# Patient Record
Sex: Male | Born: 1965
Health system: Southern US, Community
[De-identification: ages and names within clinical notes are randomized; demographics above are authoritative.]

## PROBLEM LIST (undated history)

## (undated) DIAGNOSIS — I10 Essential (primary) hypertension: Secondary | ICD-10-CM

## (undated) HISTORY — DX: Essential (primary) hypertension: I10

---

## 2002-08-19 ENCOUNTER — Ambulatory Visit (HOSPITAL_COMMUNITY): Admission: RE | Admit: 2002-08-19 | Discharge: 2002-08-19 | Payer: Self-pay | Admitting: General Surgery

## 2003-05-22 ENCOUNTER — Emergency Department (HOSPITAL_COMMUNITY): Admission: EM | Admit: 2003-05-22 | Discharge: 2003-05-23 | Payer: Self-pay | Admitting: Emergency Medicine

## 2003-05-25 ENCOUNTER — Ambulatory Visit (HOSPITAL_COMMUNITY): Admission: RE | Admit: 2003-05-25 | Discharge: 2003-05-25 | Payer: Self-pay | Admitting: Orthopedic Surgery

## 2006-07-21 ENCOUNTER — Ambulatory Visit: Admission: RE | Admit: 2006-07-21 | Discharge: 2006-07-21 | Payer: Self-pay | Admitting: *Deleted

## 2006-07-22 ENCOUNTER — Ambulatory Visit (HOSPITAL_COMMUNITY): Admission: RE | Admit: 2006-07-22 | Discharge: 2006-07-22 | Payer: Self-pay | Admitting: *Deleted

## 2006-07-30 ENCOUNTER — Ambulatory Visit: Payer: Self-pay | Admitting: Pulmonary Disease

## 2008-05-04 ENCOUNTER — Encounter (INDEPENDENT_AMBULATORY_CARE_PROVIDER_SITE_OTHER): Payer: Self-pay | Admitting: Family Medicine

## 2008-05-05 ENCOUNTER — Inpatient Hospital Stay (HOSPITAL_COMMUNITY): Admission: EM | Admit: 2008-05-05 | Discharge: 2008-05-06 | Payer: Self-pay | Admitting: Emergency Medicine

## 2009-08-06 IMAGING — CT CT ANGIO CHEST
1 of 4 series · 19 of 36 positions shown · IV contrast (Omnipaque 300)
Comparison: Chest x-ray 05/02/2008

CLINICAL DATA: Nausea vomiting and diarrhea.  Passing out.
Elevated heart rate and D-dimer.

CT ANGIOGRAPHY CHEST
TECHNIQUE: Multidetector CT imaging of the chest using the
standard protocol during bolus administration of intravenous
contrast. Multiplanar reconstructed images including MIPs were
obtained and reviewed to evaluate the vascular anatomy.
Contrast: 80 ml Omnipaque 350.

[Series 8: thin pacs · axial · 0.77mm/px · z∈[-548,-270]mm · 19 of 310 slices shown]
[im 16/310  lung]
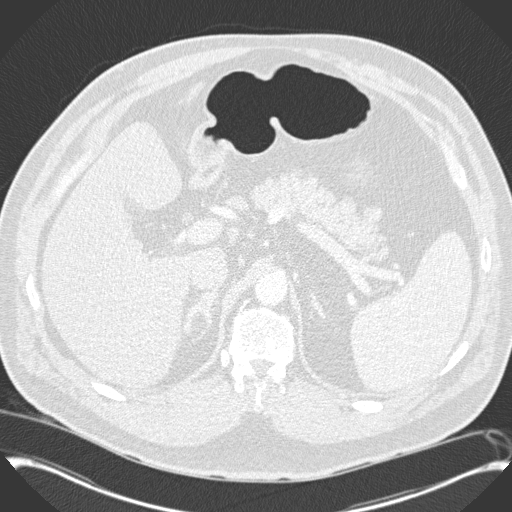
[im 31/310  mediastinal]
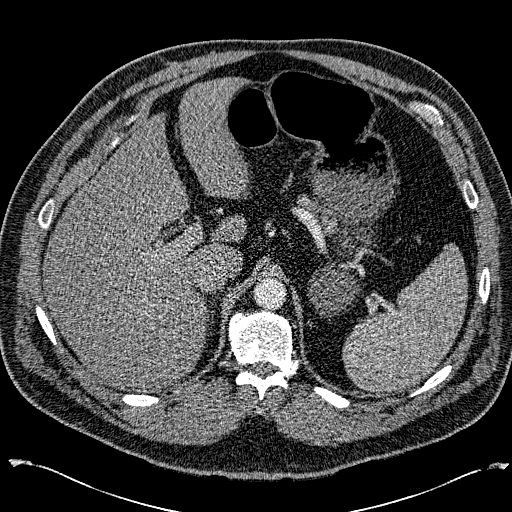
[im 47/310  lung]
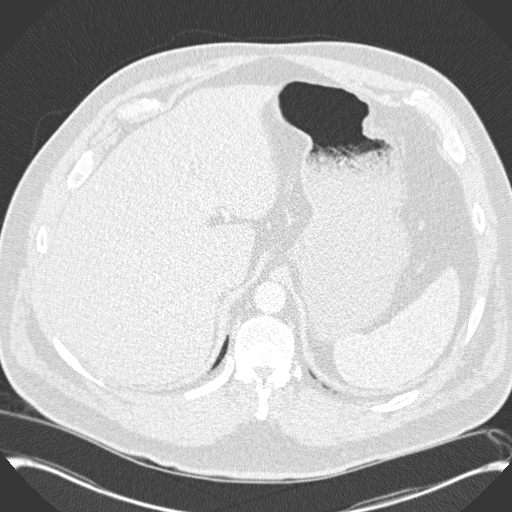
[im 62/310  mediastinal]
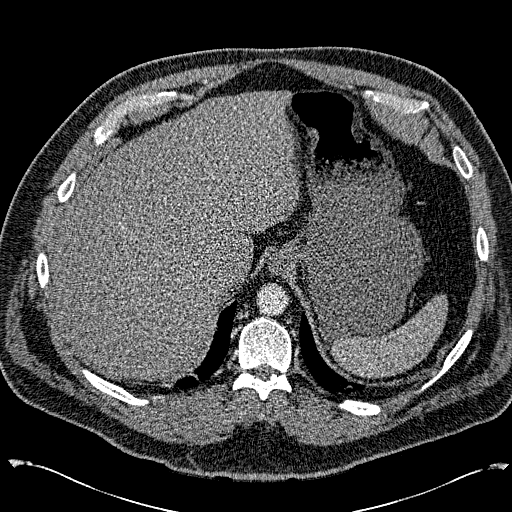
[im 78/310  lung]
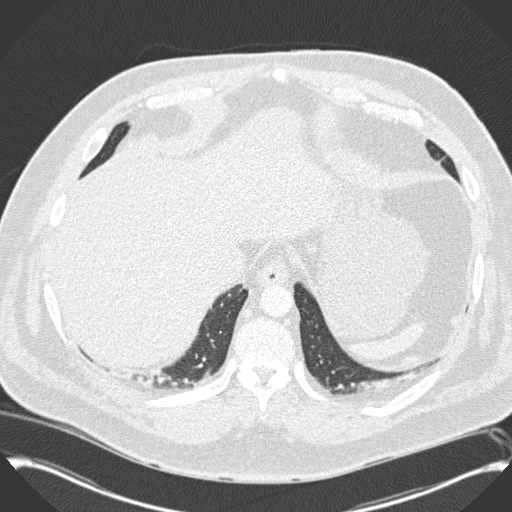
[im 93/310  mediastinal]
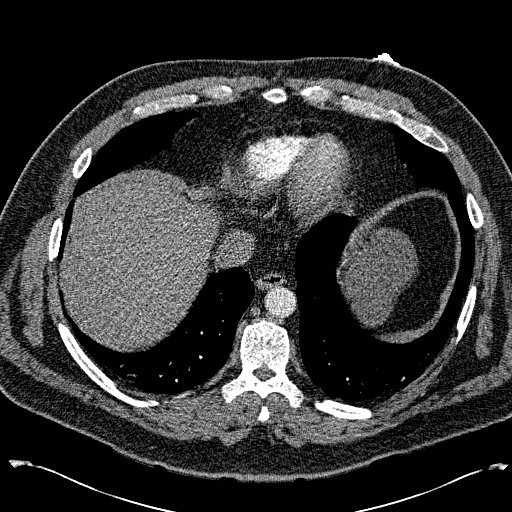
[im 109/310  lung]
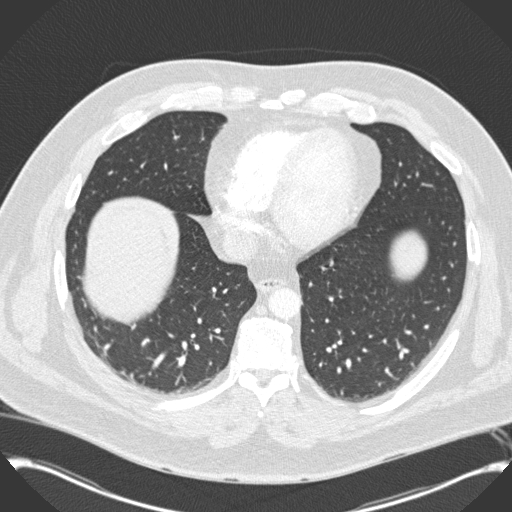
[im 124/310  mediastinal]
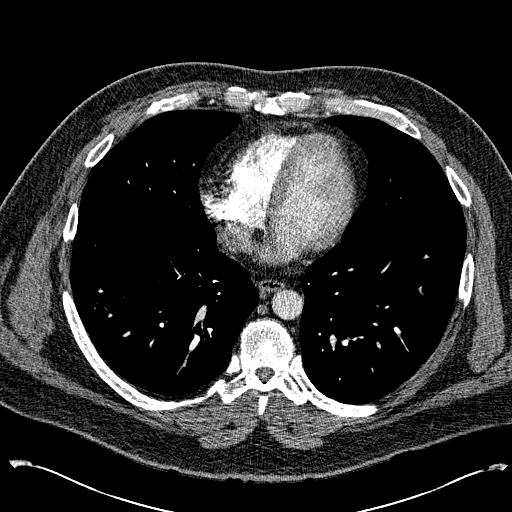
[im 140/310  lung]
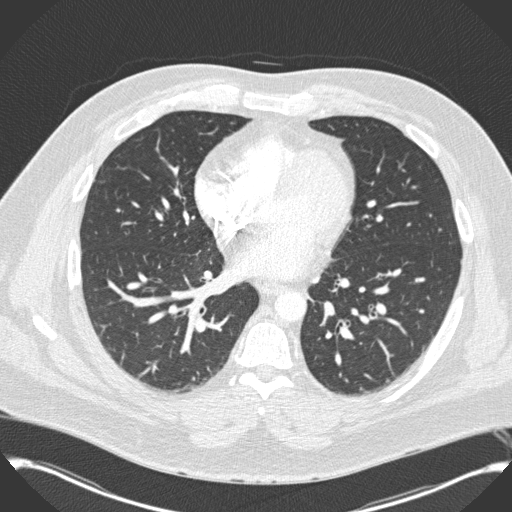
[im 155/310  mediastinal]
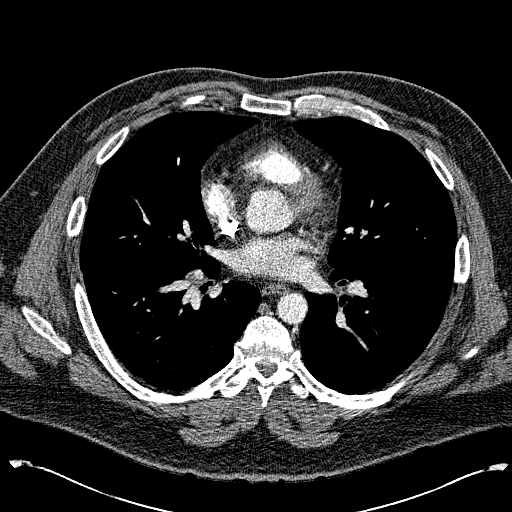
[im 170/310  lung]
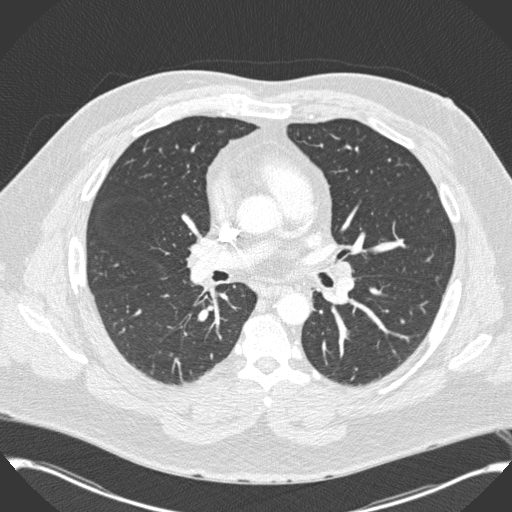
[im 186/310  mediastinal]
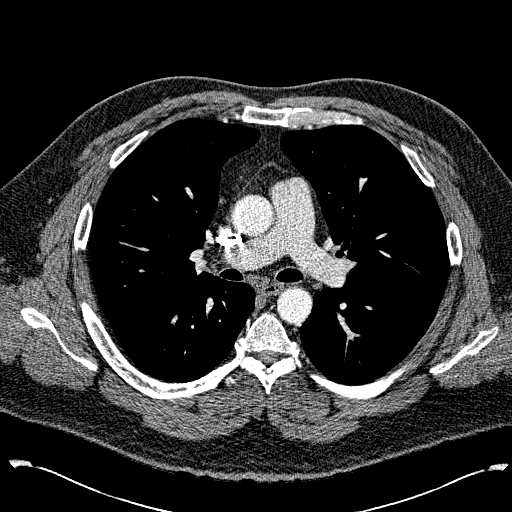
[im 201/310  lung]
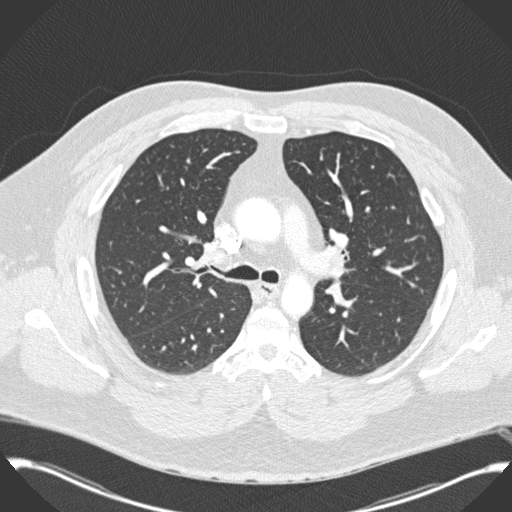
[im 217/310  mediastinal]
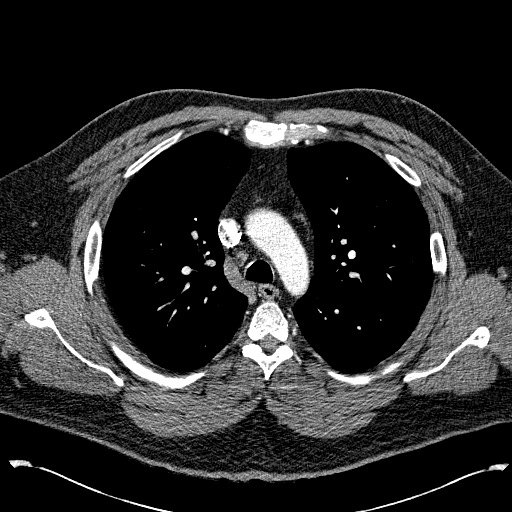
[im 232/310  lung]
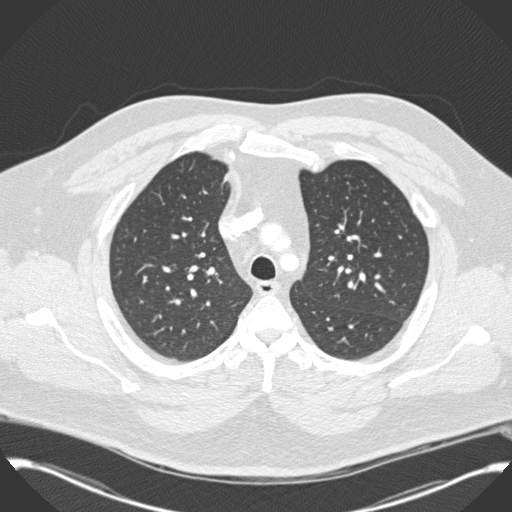
[im 248/310  mediastinal]
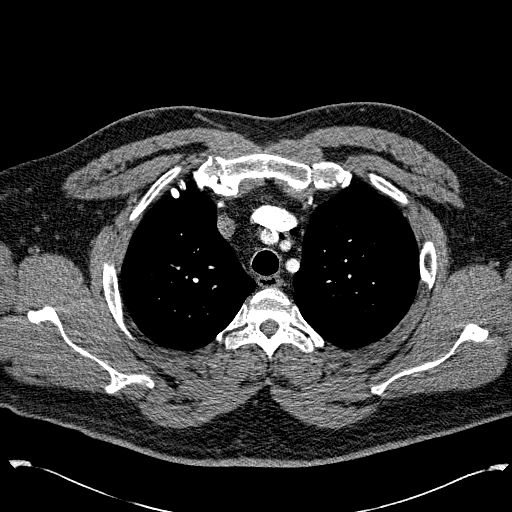
[im 263/310  lung]
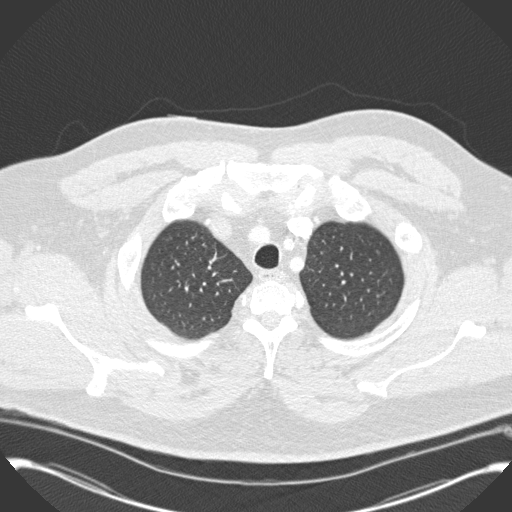
[im 279/310  mediastinal]
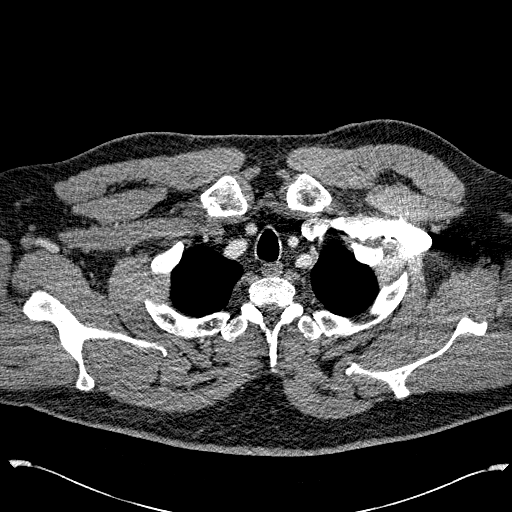
[im 294/310  lung]
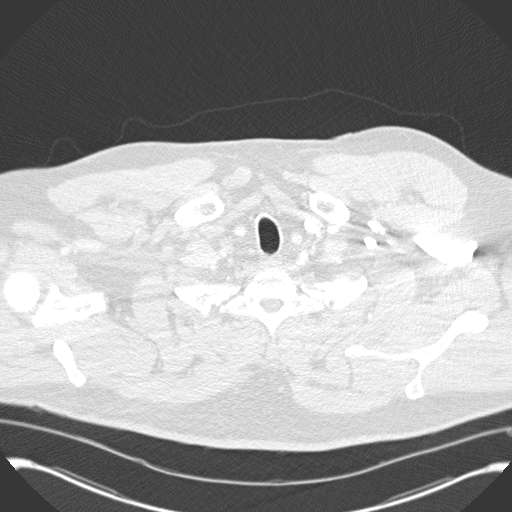

[19 of 36 positions shown; findings below may reference images not displayed]

FINDINGS: There is suboptimal opacification of the pulmonary
arterial system.  Despite this, no significant lobar or segmental
pulmonary embolus is present.  There is no significant mediastinal
or axillary adenopathy.  The heart size is normal.  No significant
pleural or pericardial effusion is present.  Limited imaging of the
upper abdomen is unremarkable.  Rule minimal dependent atelectasis
is present at the lung bases.  Lungs are otherwise clear without
focal nodule, mass, or airspace disease.
IMPRESSION: 1.  Suboptimal contrast bolus.
2.  No evidence for proximal embolus.
3.  Minimal dependent atelectasis.

## 2009-08-07 IMAGING — US US EXTREM LOW VENOUS BILAT
1 series · 13 of 24 positions shown · non-contrast
Comparison: None.

CLINICAL DATA: Elevated D-dimer.

VENOUS DUPLEX ULTRASOUND OF BILATERAL LOWER EXTREMITIES
TECHNIQUE: Gray-scale sonography with graded compression, as well
as color Doppler and duplex ultrasound, were performed to evaluate
the deep venous system of both lower extremities from the level of
the common femoral vein through the popliteal and proximal calf
veins. Spectral Doppler was utilized to evaulate flow at rest and
with distal augmentation maneuvers.

[Series 1: us extrem low venous bilat · 13 of 59 slices shown]
[im 1/59]
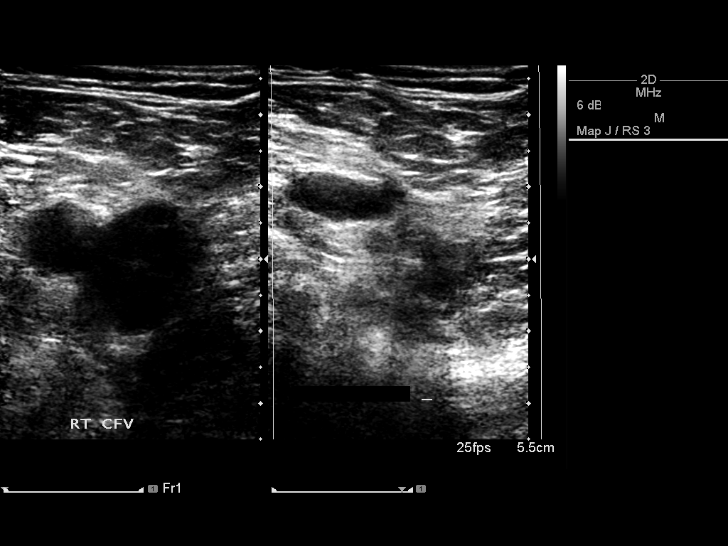
[im 6/59]
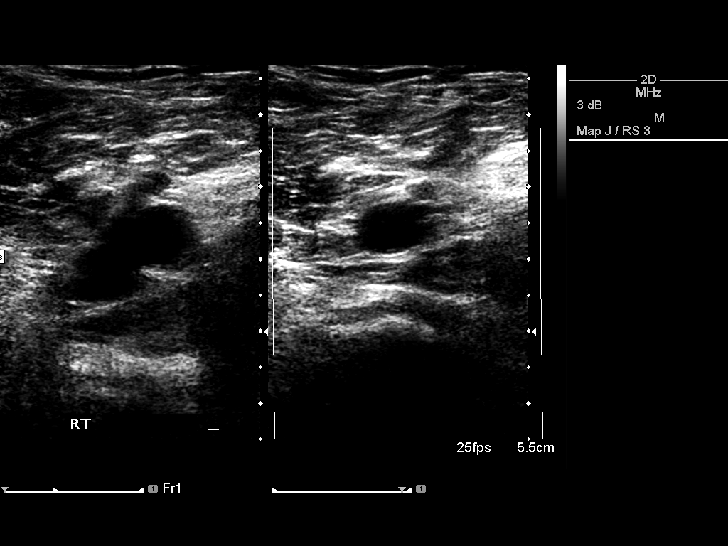
[im 11/59]
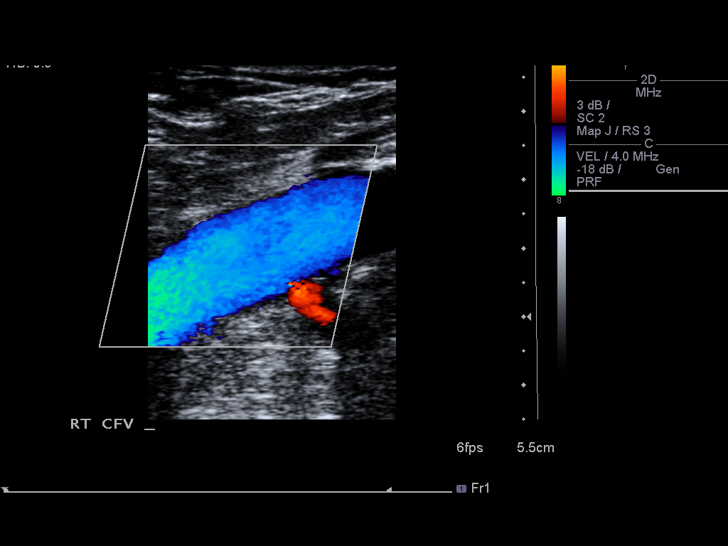
[im 16/59]
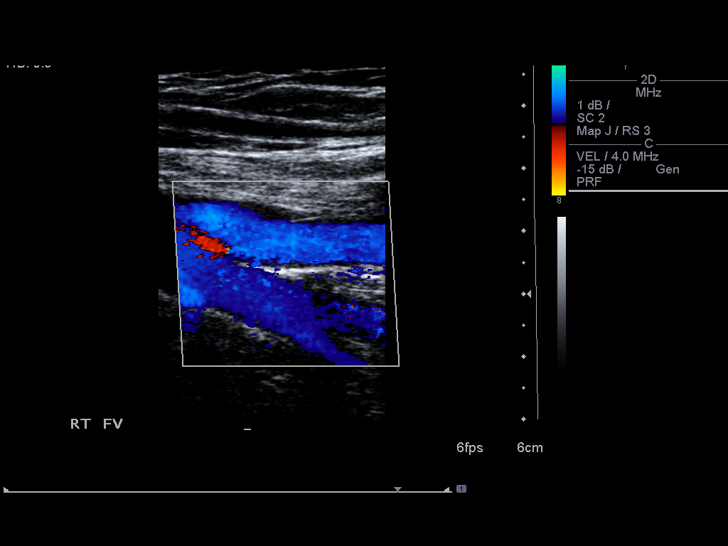
[im 21/59]
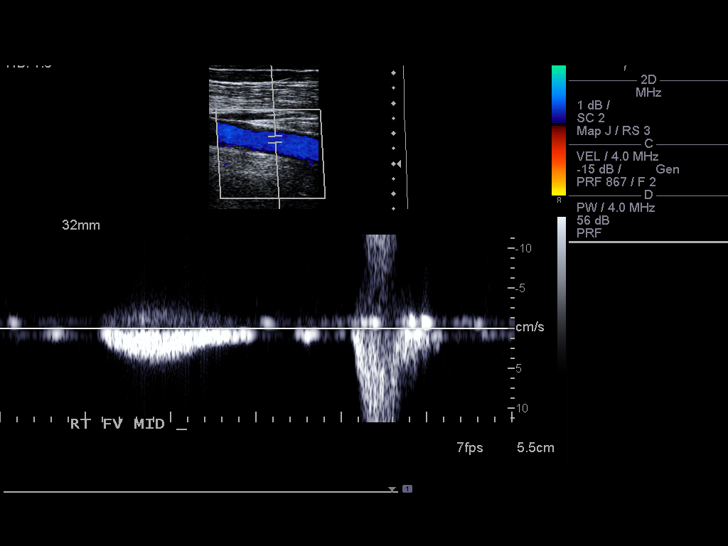
[im 26/59]
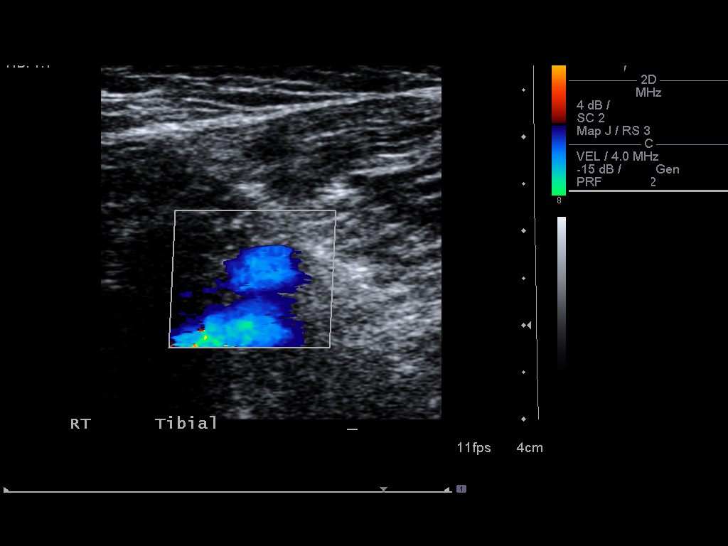
[im 31/59]
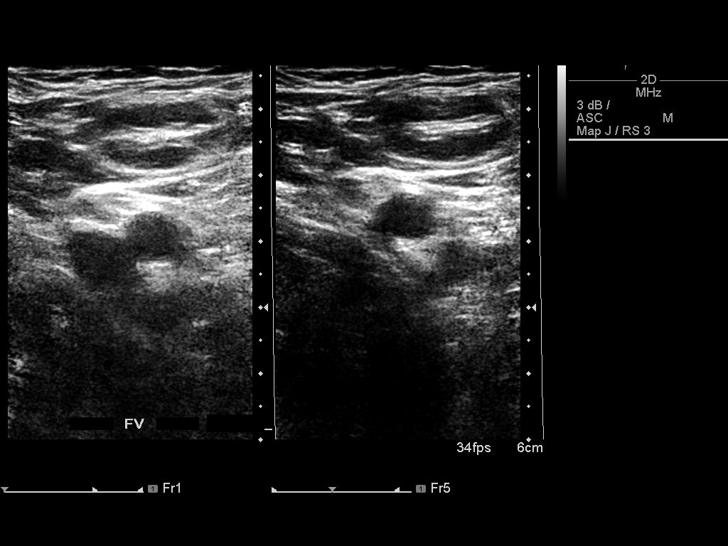
[im 33/59]
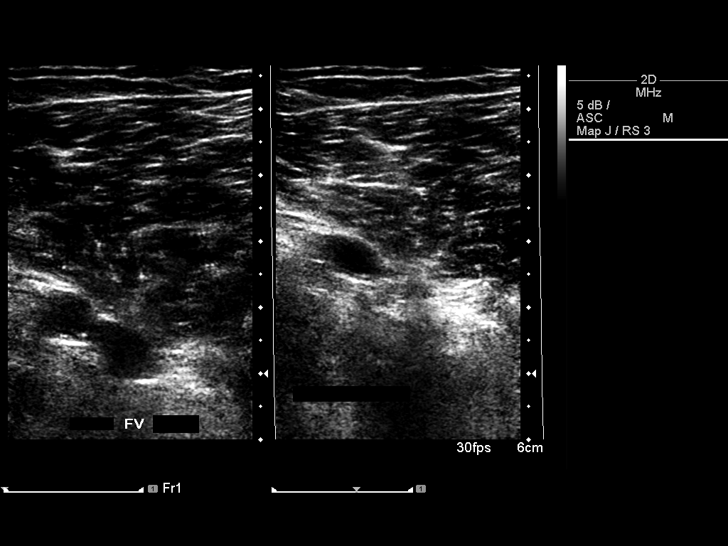
[im 38/59]
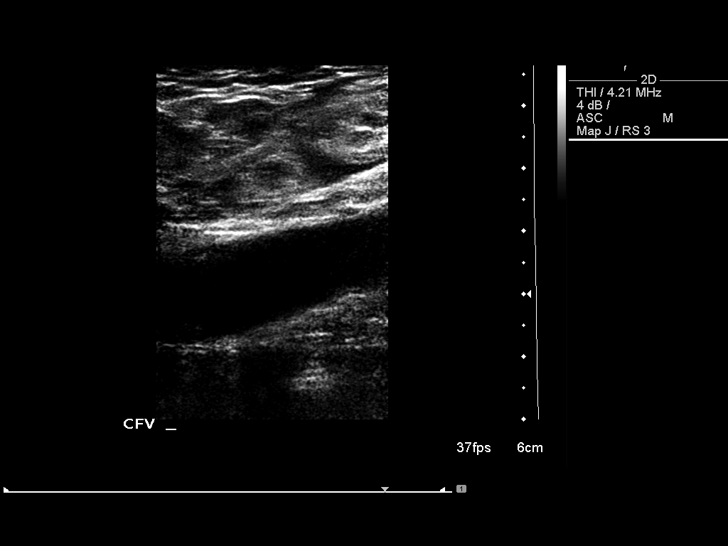
[im 43/59]
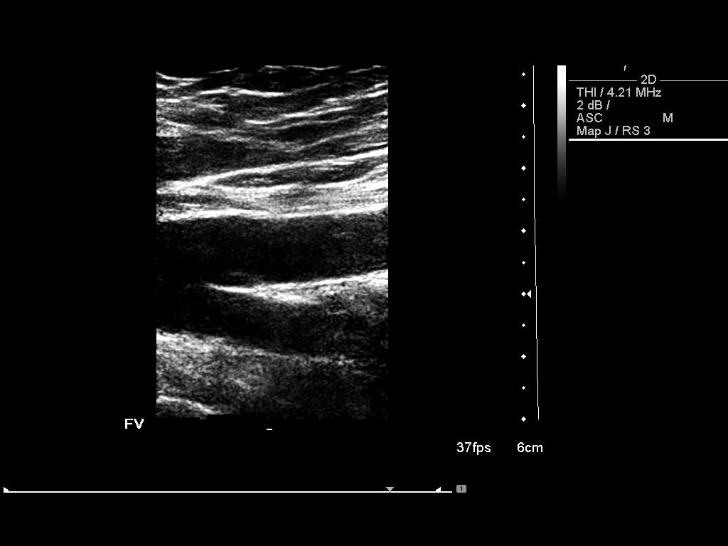
[im 48/59]
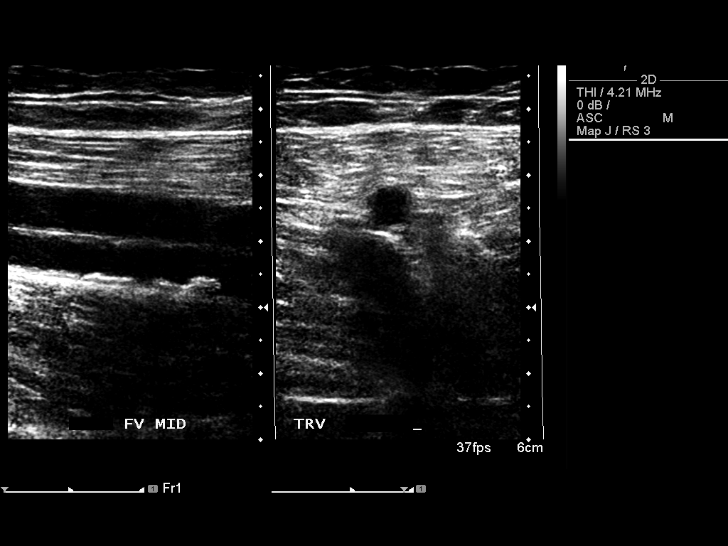
[im 53/59]
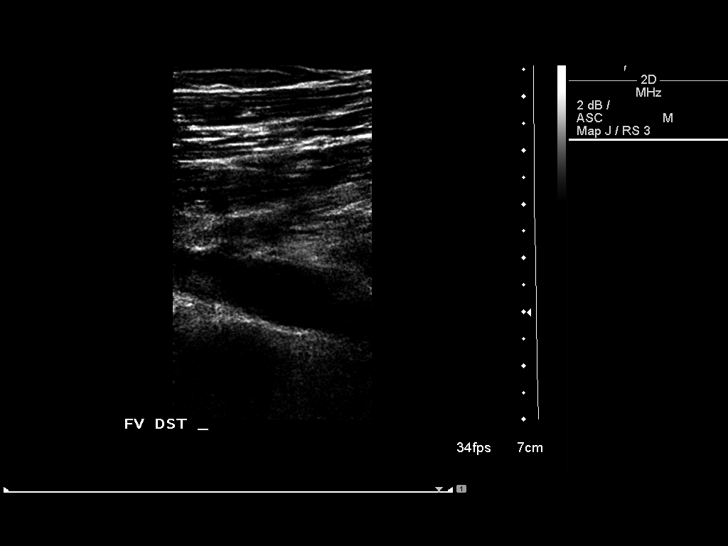
[im 59/59]
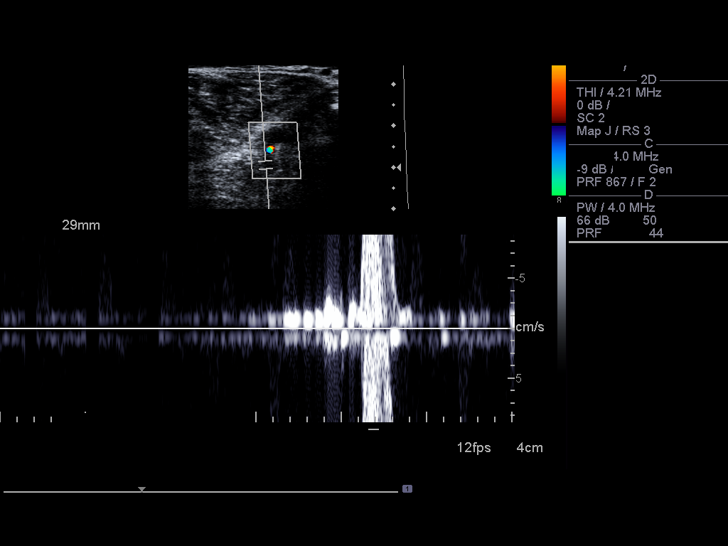

[13 of 24 positions shown; findings below may reference images not displayed]

FINDINGS: On the right, from the level of the common femoral vein
to the upper calf veins, there is adequate color flow, augmentation
and compression without findings to suggest deep venous thrombosis.

On the left, there is an appearance suggesting old deep venous
thrombosis involving the superficial femoral vein as indicated by
peripheral thickening however, no evidence of acute occluding left-
sided deep venous thrombosis is noted from the level of the left
common femoral vein to the left calf veins as there is adequate
compression and augmentation.
IMPRESSION: No evidence of right-sided deep venous thrombosis.

Findings suggestive of remote left superficial femoral vein
thrombosis without findings to suggest acute occluding thrombus at
the present time.

## 2010-11-28 NOTE — H&P (Signed)
NAME:  Gregory Andersen, Gregory Andersen NO.:  [id number]   MEDICAL RECORD NO.:  [id number]          PATIENT TYPE:  OBV   LOCATION:  A336                          FACILITY:  [facility]   PHYSICIAN:  Skeet Latch, DO    DATE OF BIRTH:  1965/08/19   DATE OF ADMISSION:  05/02/2008  DATE OF DISCHARGE:  LH                              HISTORY & PHYSICAL   PRIMARY CARE PHYSICIAN:  Dr. Barbee Cough.   CHIEF COMPLAINT:  Nausea, vomiting, and diarrhea.   HISTORY OF PRESENT ILLNESS:  This is a 45 year old who presents with  acute onset of vomiting, nausea, and diarrhea prior to coming to the  emergency room.  The patient states that he became dizzy, passed out and  hit his upper lip on a coffee table and also hit his right rib on fall.  The patient admits to 1 episode of vomiting and 3 episodes of diarrhea  within the past hour.  The patient now presented to the ER with the  above symptoms.   PAST MEDICAL HISTORY:  Hypertension and allergies.   PAST SURGICAL HISTORY:  Umbilical hernioplasty.   SOCIAL HISTORY:  He is nonsmoker.  No history of alcohol or drug abuse.   HOME MEDICATIONS:  Lisinopril 10 mg daily and fexofenadine 180 mg daily.   REVIEW OF SYSTEMS:  GASTROINTESTINAL:  Positive for some nausea and  vomiting.  No abdominal pain.  Bloody stool with constipation.  CARDIOVASCULAR:  No chest pain or palpitation.  RESPIRATORY:  No  shortness of breath, cough, or external wheezing.  HEENT:  Unremarkable.  NEUROLOGIC:  Positive for syncopal episode.   PHYSICAL EXAMINATION:  GENERAL:  Well developed, well nourished, well  hydrated, in no acute distress.  VITAL SIGNS:  Blood pressure is 120/51, heart rate 114, respiratory rate  is 20, and pulse ox is 97% on 2 L.  HEENT:  Head is atraumatic and normocephalic.  EOMI.  PERRLA.  He has a  tiny tip in left upper lateral incisor, left upper lip contusion, and 1  cm mucosal laceration inside of his mouth.  NECK:  Soft, supple, nontender, and  nondistended.  CARDIOVASCULAR:  He is tachycardic.  Regular rhythm.  No murmurs, rubs,  or gallops.  LUNGS:  Clear to auscultation bilaterally.  No rales, rhonchi, or  wheezing.  ABDOMEN:  Soft, nontender, and nondistended.  No rigidity or guarding.  MUSCULOSKELETAL:  Reveals some right chest wall tenderness.  EXTREMITIES:  No clubbing, cyanosis, or edema.  He has a small bruise on  the right dorsal of his hand.  NEUROLOGIC:  He is alert and oriented x3.  Cranial nerves II through XII  are grossly intact.   LABORATORY DATA:  Sodium 138, potassium 4.4, chloride 103, BUN 17,  creatinine 1.4, and glucose 138.  Hemoglobin 15.3 and hematocrit 48.0.  His urine is positive for protein.  D-dimer was 5.58.  CK is 119, CK-MB  is 2.0, and troponin 0.02.  A chest x-ray with no acute abnormality.  Rib x-ray was unremarkable.  CT of the chest showed no evidence of  pulmonary  embolus.   ASSESSMENT:  1. Sinus tachycardia.  2. Nausea and vomiting.  3. Syncopal episode.   PLAN:  1. The patient admitted observation to the service of [facility].  2. For his sinus tachycardia, we will add a TSH to his previous labs      at this time.  We will also obtain a Cardiology consult.  If his      heart rate goes above 110 for a sustained amount of time, may give      either beta-blocker or calcium-channel blocker.  We will hold these      medications until needed.  We will continue with IV hydration at      this time.  3. For his nausea and vomiting, the patient will be placed on      antiemetics as needed.  4. For his syncopal episodes, unsure etiology, could be related to his      acute possible viral illness.  We will continue to monitor.  We      will await cardiac recommendations regarding his tachycardia and      possible syncope.      Skeet Latch, DO  Electronically Signed     SM/MEDQ  D:  05/03/2008  T:  05/03/2008  Job:  [id number]   cc:   Barbee Cough  Fax: [phone]

## 2010-11-28 NOTE — Group Therapy Note (Signed)
NAMEMarland Kitchen  ARTIST, BLOOM NO.:  [id number]   MEDICAL RECORD NO.:  [id number]          PATIENT TYPE:  INP   LOCATION:  A307                          FACILITY:  [facility]   PHYSICIAN:  Monte Fantasia, MD  DATE OF BIRTH:  [birth year]   DATE OF PROCEDURE:  DATE OF DISCHARGE:                                 PROGRESS NOTE   HISTORY:  A 45 year old male patient admitted on May 02, 2008 with  history of syncopal episode, nausea, and vomiting.  The patient was  given IV fluids and had Cardiology evaluation done.  A 2-D echo was done  today morning, transcribed report is awaited.  As per the cardiology  consult, there was normal left ventricular function with normal valves  and mild MR, no evidence of any thrombus.  The patient was recommended  by Cardiology to discontinue heparin drip.  Venous Dopplers done on  May 04, 2008 showed no evidence of any DVT.  Today morning labs,  potassium was found to be 2.9 supplemented with potassium chloride 40  mEq 2 doses 4 hours apart.  The patient has at present no complaints.  No new episodes of any diarrhea or vomiting.   PHYSICAL EXAMINATION:  VITAL SIGNS:  Temperature of 97.3 with a heart  rate of 80, respirations of 18, blood pressure of 140/83, and O2  saturations of 98.  HEENT:  No pallor.  No icterus.  No jaundice.  No lymphadenopathy.  No  JVD.  RESPIRATORY SYSTEM:  Air entry bilaterally equal.  No rales.  No  rhonchi.  ABDOMEN:  Soft.  No evidence of any distention, guarding, or rigidity.  Bowel sounds good.  EXTREMITIES:  No evidence of any edema.  Pedal pulses are felt.  Homans  sign is negative.   LABORATORY DATA:  Total WBC count 3.9, H&H of 12.8/36, platelet count  157; sodium of 137, potassium of 3.2, chloride of 105, bicarb of 27,  glucose of 103, BUN 5, creatinine 1, and calcium of 8.3; TSH 2.27.  Blood cultures dated May 04, 2008, no growth for 1 day.  Urine  cultures, there has been no growth done on  May 03, 2008.   ASSESSMENT AND PLAN:  Syncopal episode- Secondary to diarrhea, nausea,  vomiting, possible viral gastroenteritis.  The patient at present will  discontinue the heparin drip for now as per the Cardiology  recommendation.  We will follow up with the transcribed report of the 2-  D echo.  Carotid Dopplers are normal and venous Dopplers have shown no  deep venous thrombosis.  We will continue medications as per the  reconciliation sheet   Hypokalemia:Will monitor potassium.  He will receive 2 doses of  potassium chloride 40 mEq and supplement magnesium 400 mg p.o. t.i.d.  for 3 days.  We will repeat potassium levels and follow up with the  potassium levels.   Deep venous thrombosis and gastrointestinal prophylaxis given.      Monte Fantasia, MD  Electronically Signed     MP/MEDQ  D:  05/05/2008  T:  05/06/2008  Job:  [id number]

## 2010-11-28 NOTE — Discharge Summary (Signed)
NAMEMarland Kitchen  HARWOOD, NALL NO.:  [id number]   MEDICAL RECORD NO.:  [id number]          PATIENT TYPE:  INP   LOCATION:  A307                          FACILITY:  [facility]   PHYSICIAN:  Monte Fantasia, MD  DATE OF BIRTH:  1966-06-15   DATE OF ADMISSION:  05/02/2008  DATE OF DISCHARGE:  10/22/2009LH                               DISCHARGE SUMMARY   A 45 year old male patient admitted on May 03, 2008 with acute onset  of nausea, vomiting, and diarrhea.  The patient was found to be  tachycardic, was hypertensive, was given IV fluids, and nausea and  vomiting were controlled.  Later, the potassium level was low, which was  repleted.  The patient was started initially on heparin drip in view of  possible thrombus for a possible DVT and the thrombus.  A 2-D echo  showed no evidence of left ventricular thrombus and venous Dopplers  suggested no evidence of right-sided deep venous thrombosis.  The  patient initially was started on heparin drip, which was later  discontinued, and potassium was repleted.  The patient was evaluated by  Cardiology, and in view of sinus tachycardia, was recommended to  supplement IV fluids and potassium repletion, and to discontinue the  heparin drip.  At present, the patient is symptomatically better and fit  to be discharged.   PHYSICAL EXAMINATION:  VITALS:  Today morning, temperature of 98.6,  heart rate of 82, respirations of 20, blood pressure of 122/60.  His  oxygen saturation 98% on room air.  HEENT:  No pallor.  No icterus.  Pupils equal reactive to light.  NECK:  No lymphadenopathy.  No JVD.  RESPIRATORY SYSTEM:  Air entry is bilaterally poor.  No rales.  No  rhonchi.  Vesicular breath sounds.  CVA scar.  HEART:  Both heart sounds heard.  Regular rate and rhythm.  No murmurs.  ABDOMEN:  Soft.  No organomegaly.  No distention.  No tenderness.  No  guarding or rigidity.  EXTREMITIES:  No edema.   LABORATORY DATA:  Awaiting the repeat  potassium levels.  Potassium  levels drawn.   ASSESSMENT AND PLAN:  1. Nausea, vomiting, and diarrhea possibly secondary to viral      gastroenteritis.  Received IV fluids hydration.  The patient is      doing symptomatically better.  Venous Dopplers showed no evidence      of deep venous thrombosis and 2-D echo showed no evidence of left      ventricular thrombus.  The patient's heparin drip was discontinued.   1. Hypokalemia, last potassium was 3.2, which was repleted with      potassium supplementation.  Magnesium was repleted.  Repeat      potassium and magnesium levels pending.  Plan to discharge if      cleared by Cardiology.  The patient can be discharged home.      Monte Fantasia, MD  Electronically Signed     MP/MEDQ  D:  05/06/2008  T:  05/06/2008  Job:  [id number]
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
## 2010-12-01 NOTE — Procedures (Signed)
NAMEMarland Andersen  BRENN, DEZIEL NO.:  [id number]   MEDICAL RECORD NO.:  [id number]          PATIENT TYPE:  OUT   LOCATION:  SLEEP LAB                     FACILITY:  [facility]   PHYSICIAN:  Barbaraann Share, MD,FCCPDATE OF BIRTH:  1966/04/25   DATE OF STUDY:  07/21/2006                            NOCTURNAL POLYSOMNOGRAM   LOCATION:  Sleep lab.   INDICATION FOR STUDY:  Hypersomnia with sleep apnea.   EPWORTH SCORE:  3.   SLEEP ARCHITECTURE:  The patient had total sleep time of 316 minutes  with very decreased REM and never achieved slow-wave sleep.  Sleep onset  latency was prolonged at 40 minutes and REM onset was normal at 67  minutes.  Sleep efficiency was very decreased at 76%.   RESPIRATORY DATA:  Patient was found to have 30 hypopneas and 28  obstructive apneas for a respiratory disturbance index of 11 events per  hour.  Events were not positional, but there was moderate-to-loud  snoring noted throughout.  Patient did not meet split-night protocol  secondary to only small numbers of events in the first half of the  night.   OXYGEN DATA:  There was O2 desaturation as low as 76% with the patient's  obstructive events.   CARDIAC DATA:  Rare PVC, but no clinically significant cardiac  arrhythmia.   MOVEMENT/PARASOMNIA:  None.   IMPRESSION/RECOMMENDATION:  Mild obstructive sleep apnea/hypopnea  syndrome with a respiratory disturbance index of 11 events per hour and  O2 desaturation as low as 76%.  Patient, however, did not achieve slow-  wave sleep and there was very little rapid eye movement (REM).  Therefore, this study may underestimate the degree of her obstructive  sleep apnea.  Treatment for this degree of sleep apnea should include  weight loss alone if  applicable, upper airway surgery, oral appliance, and also continuous  positive airway pressure.  Clinical correlation is suggested.      Barbaraann Share, MD,FCCP  Diplomate, American Board of Sleep  Medicine  Electronically Signed     [facility]/MEDQ  D:  07/31/2006 16:29:56  T:  08/01/2006 08:46:53  Job:  [id number]

## 2010-12-01 NOTE — H&P (Signed)
   NAME:  Gregory Andersen, Gregory Andersen NO.:  [id number]   MEDICAL RECORD NO.:  [id number]                   PATIENT TYPE:   LOCATION:                                       FACILITY:   PHYSICIAN:  Dalia Heading, M.D.               DATE OF BIRTH:  08/10/1965   DATE OF ADMISSION:  08/19/2002  DATE OF DISCHARGE:                                HISTORY & PHYSICAL   CHIEF COMPLAINT:  Umbilical hernia.   HISTORY OF PRESENT ILLNESS:  The patient is a 45 year old white male who is  referred for evaluation and treatment of an umbilical hernia.  It is made  worse with straining.  It is starting to cause increasing discomfort.   PAST MEDICAL HISTORY:  Includes extrinsic allergies.   PAST SURGICAL HISTORY:  Unremarkable.   CURRENT MEDICATIONS:  Occasional over-the-counter allergy medications.   ALLERGIES:  No known drug allergies.   REVIEW OF SYSTEMS:  Noncontributory.   PHYSICAL EXAMINATION:  GENERAL:  The patient is a well-developed, well-  nourished white male in no acute distress.  VITAL SIGNS:  He is afebrile and vital signs are stable.  LUNGS:  Clear to auscultation with equal breath sounds bilaterally.  HEART:  Reveals a regular rate and rhythm without S3, S4, or murmurs.  ABDOMEN:  Soft, nontender, nondistended.  No hepatosplenomegaly or masses  are noted.  A reducible umbilical hernia is noted.   IMPRESSION:  Umbilical hernia.    PLAN:  The patient is scheduled for umbilical herniorrhaphy on August 19, 2002.  The risks and benefits of the procedure including bleeding,  infection, and recurrence of the hernia were fully explained to the patient,  who gave informed consent.                                               Dalia Heading, M.D.    MAJ/MEDQ  D:  08/17/2002  T:  08/17/2002  Job:  [id number]   cc:   Madelin Rear. Sherwood Gambler, M.D.  [address]  [place]  [place] [address]  Fax: [phone]

## 2010-12-01 NOTE — Op Note (Signed)
   NAME:  Gregory Andersen, Gregory Andersen NO.:  [id number]   MEDICAL RECORD NO.:  [id number]                   PATIENT TYPE:  AMB   LOCATION:  DAY                                  FACILITY:  [facility]   PHYSICIAN:  Dalia Heading, M.D.               DATE OF BIRTH:  [birth year]   DATE OF PROCEDURE:  08/19/2002  DATE OF DISCHARGE:                                 OPERATIVE REPORT   PREOPERATIVE DIAGNOSIS:  Umbilical hernia.   POSTOPERATIVE DIAGNOSIS:  Umbilical hernia.   OPERATION/PROCEDURE:  Umbilical herniorrhaphy.   ANESTHESIA:  General endotracheal.   INDICATIONS FOR PROCEDURE:  The patient is a 45 year old white male who  presents with a symptomatic umbilical hernia.  The risks and benefits of the  procedure including bleeding, infection and recurrence of the hernia were  fully explained to the patient, gaining informed consent.   DESCRIPTION OF PROCEDURE:  The patient was placed in the supine position.  After induction of general endotracheal anesthesia, the abdomen was prepped  and draped using the usual sterile technique with Betadine.  A surgical site  confirmation was performed.   An infraumbilical incision was made down to the fascia.  The umbilicus was  freed away from the underlying fascia.  A small umbilical hernia defect was  noted.  The properitoneal fat was noted to be within this hernia sac.  This  was excised along with the hernia sac down to the base of the fascia.  The  fascia was then reapproximated using 0 Ethibond interrupted sutures.  The  base of the umbilicus was secured back to the fascia using a 2-0 Vicryl  interrupted suture.  The subcutaneous layer was reapproximated using a 3-0  Vicryl interrupted suture.  The skin was closed using a 4-0 Vicryl  subcuticular suture.  A 0.5% Sensorcaine was instilled into the surrounding  wound and the wound was covered with Collodion.   All tape and needle counts were correct at the end of the procedure.   The  patient was extubated in the operating room and went back to the recovery  room, awake and in stable condition.   COMPLICATIONS:  None.    SPECIMENS:  None.   ESTIMATED BLOOD LOSS:  Minimal.                                                Dalia Heading, M.D.    MAJ/MEDQ  D:  08/19/2002  T:  08/19/2002  Job:  [id number]   cc:   Madelin Rear. Sherwood Gambler, M.D.  [address]  [place]  [place] [address]  Fax: [phone]

## 2010-12-01 NOTE — Procedures (Signed)
NAMEMarland Kitchen  Gregory Andersen, Gregory Andersen NO.:  [id number]   MEDICAL RECORD NO.:  [id number]          PATIENT TYPE:  OUT   LOCATION:  RAD                           FACILITY:  [facility]   PHYSICIAN:  Dani Gobble, MD       DATE OF BIRTH:  11/22/1965   DATE OF PROCEDURE:  07/22/2006  DATE OF DISCHARGE:                                ECHOCARDIOGRAM   REFERRING:  Is Dr. Regino Schultze and Dr. Domingo Sep.   INDICATIONS:  A 45 year old gentleman without prior cardiac history who  has been experiencing chest pain.   The technical quality of this study is limited secondary to patient body  habitus and poor acoustic windows.   Aorta measures normally at 3.1 cm.   The left atrium is at the upper limits of normal measured at 4 cm.  The  patient appeared to be in sinus rhythm during this procedure.   The interventricular septum and posterior wall are borderline  hypertrophied.   The aortic valve is not well visualized but appeared to be grossly  structurally normal.  No significant aortic insufficiency is noted.  Doppler interrogation of the aortic valve is within normal limits.   The mitral valve also is poorly visualized but appeared to be grossly  structurally normal.  No significant mitral regurgitation is noted.   The pulmonic valve is incompletely visualized but appeared to be grossly  structurally normal.   Tricuspid valve also appeared grossly structurally normal.   The left ventricle is normal in size with LV IDD measured at 4.5 cm, LV  IC measured 3 cm.  Overall left systolic function is normal and no  regional wall motion abnormalities are appreciated.   The right-sided structures are not well visualized but overall right  ventricular systolic function appeared to be preserved.   IMPRESSION:  1. Left atrium at upper limits of normal in thickness.  2. Borderline concentric left ventricular hypertrophy.  3. Normal left ventricular size and systolic function without obvious  regional wall motion abnormality noted.  4. Technically difficult study secondary to patient body habitus, poor      acoustic windows.           ______________________________  Dani Gobble, MD     AB/MEDQ  D:  07/22/2006  T:  07/22/2006  Job:  [id number]   cc:   Kirk Ruths, M.D.  Fax: [phone]
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
## 2011-04-16 LAB — LIPID PANEL
Cholesterol: 136
HDL: 31 — ABNORMAL LOW
LDL Cholesterol: 89

## 2011-04-16 LAB — CULTURE, BLOOD (ROUTINE X 2)
Culture: NO GROWTH
Culture: NO GROWTH
Report Status: 10252009
Report Status: 10252009

## 2011-04-16 LAB — CBC
HCT: 36.4 — ABNORMAL LOW
HCT: 36.5 — ABNORMAL LOW
Hemoglobin: 12.8 — ABNORMAL LOW
MCV: 93.4
MCV: 94.2
Platelets: 153
Platelets: 157
RBC: 3.86 — ABNORMAL LOW
RDW: 12
WBC: 6.4

## 2011-04-16 LAB — BASIC METABOLIC PANEL
BUN: 5 — ABNORMAL LOW
BUN: 5 — ABNORMAL LOW
BUN: 9
CO2: 25
CO2: 29
Calcium: 7.8 — ABNORMAL LOW
Chloride: 101
Chloride: 103
Chloride: 105
Creatinine, Ser: 0.98
Creatinine, Ser: 1.08
Creatinine, Ser: 1.22
GFR calc Af Amer: >60
GFR calc non Af Amer: >60
GFR calc non Af Amer: >60
Glucose, Bld: 103 — ABNORMAL HIGH
Glucose, Bld: 142 — ABNORMAL HIGH
Glucose, Bld: 90
Potassium: 2.9 — ABNORMAL LOW
Potassium: 3.2 — ABNORMAL LOW
Sodium: 134 — ABNORMAL LOW

## 2011-04-16 LAB — POCT I-STAT, CHEM 8
BUN: 17
Calcium, Ion: 1.09 — ABNORMAL LOW
Chloride: 103
Creatinine, Ser: 1.4
Glucose, Bld: 138 — ABNORMAL HIGH
HCT: 48
Hemoglobin: 16.3
Potassium: 4.4
Sodium: 138
TCO2: 27

## 2011-04-16 LAB — CARDIAC PANEL(CRET KIN+CKTOT+MB+TROPI)
CK, MB: 0.6
CK, MB: 0.9
CK, MB: 0.9
Relative Index: 0.5
Relative Index: 0.7
Total CK: 112
Total CK: 129
Troponin I: 0.01
Troponin I: 0.02
Troponin I: 0.02

## 2011-04-16 LAB — URINALYSIS, ROUTINE W REFLEX MICROSCOPIC
Bilirubin Urine: NEGATIVE
Glucose, UA: NEGATIVE
Hgb urine dipstick: NEGATIVE
Ketones, ur: NEGATIVE
Leukocytes, UA: NEGATIVE
Nitrite: NEGATIVE
Protein, ur: 30 — AB
Specific Gravity, Urine: >1.03 — ABNORMAL HIGH
Urobilinogen, UA: 0.2
pH: 6

## 2011-04-16 LAB — URINE MICROSCOPIC-ADD ON

## 2011-04-16 LAB — DIFFERENTIAL
Basophils Absolute: 0
Eosinophils Absolute: 0.2
Eosinophils Absolute: 0.4
Eosinophils Relative: 3
Eosinophils Relative: 7 — ABNORMAL HIGH
Lymphocytes Relative: 26
Lymphs Abs: 1.6
Monocytes Absolute: 0.6
Monocytes Relative: 9

## 2011-04-16 LAB — RAPID URINE DRUG SCREEN, HOSP PERFORMED
Amphetamines: NOT DETECTED
Barbiturates: NOT DETECTED
Benzodiazepines: NOT DETECTED
Cocaine: NOT DETECTED
Opiates: NOT DETECTED
Tetrahydrocannabinol: NOT DETECTED

## 2011-04-16 LAB — HEPARIN LEVEL (UNFRACTIONATED)
Heparin Unfractionated: 0.27 — ABNORMAL LOW
Heparin Unfractionated: <0.1 — ABNORMAL LOW

## 2011-04-16 LAB — URINE CULTURE
Colony Count: NO GROWTH
Culture: NO GROWTH
Culture: NO GROWTH

## 2011-04-16 LAB — TSH
TSH: 1.414
TSH: 2.279

## 2011-04-16 LAB — PROTIME-INR
INR: 1.1
Prothrombin Time: 14.3

## 2011-04-16 LAB — HEMOGLOBIN A1C: Hgb A1c MFr Bld: 5.4

## 2011-04-16 LAB — APTT: aPTT: 34

## 2011-04-16 LAB — CK TOTAL AND CKMB (NOT AT ARMC)
CK, MB: 2
Relative Index: 1.7
Total CK: 119

## 2011-04-16 LAB — D-DIMER, QUANTITATIVE: D-Dimer, Quant: 5.58 — ABNORMAL HIGH

## 2011-04-16 LAB — TROPONIN I: Troponin I: 0.02

## 2015-09-14 MED FILL — OMEPRAZOLE DR 20 MG CAPSULE: 20 | 90 days supply | Qty: 90 | Fill #3

## 2015-09-17 DIAGNOSIS — I10 Essential (primary) hypertension: Secondary | ICD-10-CM | POA: Diagnosis not present

## 2015-09-19 MED FILL — LOSARTAN POTASSIUM 100 MG T: 100 | 90 days supply | Qty: 90 | Fill #0

## 2015-09-20 DIAGNOSIS — J111 Influenza due to unidentified influenza virus with other respiratory manifestations: Secondary | ICD-10-CM | POA: Diagnosis not present

## 2015-09-20 DIAGNOSIS — M791 Myalgia: Secondary | ICD-10-CM | POA: Diagnosis not present

## 2015-09-20 DIAGNOSIS — R05 Cough: Secondary | ICD-10-CM | POA: Diagnosis not present

## 2015-12-19 MED FILL — LOSARTAN POTASSIUM 100 MG T: 100 | 90 days supply | Qty: 90 | Fill #1

## 2015-12-22 MED FILL — OMEPRAZOLE DR 20 MG CAPSULE: 20 | 90 days supply | Qty: 90 | Fill #0

## 2016-03-26 MED FILL — OMEPRAZOLE DR 20 MG CAPSULE: 20 | 90 days supply | Qty: 90 | Fill #1

## 2016-03-26 MED FILL — LOSARTAN POTASSIUM 100 MG T: 100 | 90 days supply | Qty: 90 | Fill #2

## 2016-06-22 MED FILL — LOSARTAN POTASSIUM 100 MG T: 100 | 90 days supply | Qty: 90 | Fill #3

## 2016-06-22 MED FILL — OMEPRAZOLE DR 20 MG CAPSULE: 20 | 90 days supply | Qty: 90 | Fill #2

## 2016-09-24 MED FILL — OMEPRAZOLE DR 20 MG CAPSULE: 20 | 90 days supply | Qty: 90 | Fill #3

## 2016-09-24 MED FILL — LOSARTAN POTASSIUM 100 MG T: 100 | 90 days supply | Qty: 90 | Fill #0

## 2016-12-31 MED FILL — LOSARTAN POTASSIUM 100 MG T: 100 | 90 days supply | Qty: 90 | Fill #1

## 2017-01-10 MED FILL — OMEPRAZOLE DR 20 MG CAPSULE: 20 | 90 days supply | Qty: 90 | Fill #0

## 2017-02-25 DIAGNOSIS — Z125 Encounter for screening for malignant neoplasm of prostate: Secondary | ICD-10-CM | POA: Diagnosis not present

## 2017-02-25 DIAGNOSIS — R7301 Impaired fasting glucose: Secondary | ICD-10-CM | POA: Diagnosis not present

## 2017-02-25 DIAGNOSIS — I10 Essential (primary) hypertension: Secondary | ICD-10-CM | POA: Diagnosis not present

## 2017-02-27 DIAGNOSIS — J029 Acute pharyngitis, unspecified: Secondary | ICD-10-CM | POA: Diagnosis not present

## 2017-02-27 DIAGNOSIS — Z6839 Body mass index (BMI) 39.0-39.9, adult: Secondary | ICD-10-CM | POA: Diagnosis not present

## 2017-02-27 DIAGNOSIS — R7301 Impaired fasting glucose: Secondary | ICD-10-CM | POA: Diagnosis not present

## 2017-02-27 DIAGNOSIS — Z0001 Encounter for general adult medical examination with abnormal findings: Secondary | ICD-10-CM | POA: Diagnosis not present

## 2017-02-27 DIAGNOSIS — F5221 Male erectile disorder: Secondary | ICD-10-CM | POA: Diagnosis not present

## 2017-02-27 DIAGNOSIS — K219 Gastro-esophageal reflux disease without esophagitis: Secondary | ICD-10-CM | POA: Diagnosis not present

## 2017-02-27 DIAGNOSIS — J358 Other chronic diseases of tonsils and adenoids: Secondary | ICD-10-CM | POA: Diagnosis not present

## 2017-02-27 DIAGNOSIS — I10 Essential (primary) hypertension: Secondary | ICD-10-CM | POA: Diagnosis not present

## 2017-03-27 MED FILL — OMEPRAZOLE 20 MG CAP: 20 | 90 days supply | Qty: 90 | Fill #1

## 2017-03-27 MED FILL — LOSARTAN POTASSIUM 100 MG T: 100 | 90 days supply | Qty: 90 | Fill #2

## 2017-07-03 MED FILL — OMEPRAZOLE 20 MG CAP: 20 | 90 days supply | Qty: 90 | Fill #2

## 2017-07-05 MED FILL — LOSARTAN POTASSIUM 100 MG T: 100 | 90 days supply | Qty: 90 | Fill #0

## 2017-09-03 DIAGNOSIS — R7301 Impaired fasting glucose: Secondary | ICD-10-CM | POA: Diagnosis not present

## 2017-09-03 DIAGNOSIS — I10 Essential (primary) hypertension: Secondary | ICD-10-CM | POA: Diagnosis not present

## 2017-09-03 DIAGNOSIS — Z6839 Body mass index (BMI) 39.0-39.9, adult: Secondary | ICD-10-CM | POA: Diagnosis not present

## 2017-09-03 DIAGNOSIS — K219 Gastro-esophageal reflux disease without esophagitis: Secondary | ICD-10-CM | POA: Diagnosis not present

## 2018-03-11 DIAGNOSIS — F5221 Male erectile disorder: Secondary | ICD-10-CM | POA: Diagnosis not present

## 2018-03-11 DIAGNOSIS — K219 Gastro-esophageal reflux disease without esophagitis: Secondary | ICD-10-CM | POA: Diagnosis not present

## 2018-03-11 DIAGNOSIS — I10 Essential (primary) hypertension: Secondary | ICD-10-CM | POA: Diagnosis not present

## 2018-03-11 DIAGNOSIS — R7301 Impaired fasting glucose: Secondary | ICD-10-CM | POA: Diagnosis not present

## 2018-03-11 DIAGNOSIS — Z6839 Body mass index (BMI) 39.0-39.9, adult: Secondary | ICD-10-CM | POA: Diagnosis not present

## 2018-03-21 DIAGNOSIS — Z0001 Encounter for general adult medical examination with abnormal findings: Secondary | ICD-10-CM | POA: Diagnosis not present

## 2018-03-21 DIAGNOSIS — I10 Essential (primary) hypertension: Secondary | ICD-10-CM | POA: Diagnosis not present

## 2018-03-21 DIAGNOSIS — K219 Gastro-esophageal reflux disease without esophagitis: Secondary | ICD-10-CM | POA: Diagnosis not present

## 2018-03-21 DIAGNOSIS — R7301 Impaired fasting glucose: Secondary | ICD-10-CM | POA: Diagnosis not present

## 2018-10-28 DIAGNOSIS — E669 Obesity, unspecified: Secondary | ICD-10-CM | POA: Diagnosis not present

## 2018-10-28 DIAGNOSIS — K219 Gastro-esophageal reflux disease without esophagitis: Secondary | ICD-10-CM | POA: Diagnosis not present

## 2018-10-28 DIAGNOSIS — I10 Essential (primary) hypertension: Secondary | ICD-10-CM | POA: Diagnosis not present

## 2018-10-28 DIAGNOSIS — R7301 Impaired fasting glucose: Secondary | ICD-10-CM | POA: Diagnosis not present

## 2019-05-27 DIAGNOSIS — Z0001 Encounter for general adult medical examination with abnormal findings: Secondary | ICD-10-CM | POA: Diagnosis not present

## 2019-05-27 DIAGNOSIS — K219 Gastro-esophageal reflux disease without esophagitis: Secondary | ICD-10-CM | POA: Diagnosis not present

## 2019-05-27 DIAGNOSIS — R7301 Impaired fasting glucose: Secondary | ICD-10-CM | POA: Diagnosis not present

## 2019-05-27 DIAGNOSIS — I10 Essential (primary) hypertension: Secondary | ICD-10-CM | POA: Diagnosis not present

## 2022-06-15 DIAGNOSIS — R7303 Prediabetes: Secondary | ICD-10-CM | POA: Diagnosis not present

## 2022-06-15 DIAGNOSIS — E785 Hyperlipidemia, unspecified: Secondary | ICD-10-CM | POA: Diagnosis not present

## 2022-06-22 DIAGNOSIS — I10 Essential (primary) hypertension: Secondary | ICD-10-CM | POA: Diagnosis not present

## 2022-06-22 DIAGNOSIS — E785 Hyperlipidemia, unspecified: Secondary | ICD-10-CM | POA: Diagnosis not present

## 2022-06-22 DIAGNOSIS — E875 Hyperkalemia: Secondary | ICD-10-CM | POA: Diagnosis not present

## 2022-06-22 DIAGNOSIS — K219 Gastro-esophageal reflux disease without esophagitis: Secondary | ICD-10-CM | POA: Diagnosis not present

## 2022-06-22 DIAGNOSIS — M25511 Pain in right shoulder: Secondary | ICD-10-CM | POA: Diagnosis not present

## 2022-06-22 DIAGNOSIS — E1169 Type 2 diabetes mellitus with other specified complication: Secondary | ICD-10-CM | POA: Diagnosis not present

## 2022-06-22 DIAGNOSIS — L308 Other specified dermatitis: Secondary | ICD-10-CM | POA: Diagnosis not present

## 2022-06-22 DIAGNOSIS — M79644 Pain in right finger(s): Secondary | ICD-10-CM | POA: Diagnosis not present

## 2022-07-20 ENCOUNTER — Ambulatory Visit (INDEPENDENT_AMBULATORY_CARE_PROVIDER_SITE_OTHER): Payer: 59

## 2022-07-20 ENCOUNTER — Encounter: Payer: Self-pay | Admitting: Orthopedic Surgery

## 2022-07-20 ENCOUNTER — Ambulatory Visit (INDEPENDENT_AMBULATORY_CARE_PROVIDER_SITE_OTHER): Payer: 59 | Admitting: Orthopedic Surgery

## 2022-07-20 VITALS — BP 166/104 | HR 82 | Ht 74.0 in | Wt 308.0 lb

## 2022-07-20 DIAGNOSIS — M25511 Pain in right shoulder: Secondary | ICD-10-CM | POA: Diagnosis not present

## 2022-07-20 DIAGNOSIS — R29898 Other symptoms and signs involving the musculoskeletal system: Secondary | ICD-10-CM | POA: Diagnosis not present

## 2022-07-20 DIAGNOSIS — M5020 Other cervical disc displacement, unspecified cervical region: Secondary | ICD-10-CM | POA: Diagnosis not present

## 2022-07-20 DIAGNOSIS — G8929 Other chronic pain: Secondary | ICD-10-CM

## 2022-07-20 NOTE — Patient Instructions (Signed)
You will have your imaging done at [facility]. Call and schedule your appointment within one week. We will work on your approval.   [facility] [address] [place] [address] PHONE: [phone]  

## 2022-07-20 NOTE — Progress Notes (Signed)
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  He comes in with notes from Dr. Sandi Carne chiropractor with 2 treatments mainly focusing on the neck and shoulder indicating some right deltoid weakness at the time  Patient has weakness in his right shoulder but no pain  He has trouble flexing his elbow and abducting and elevating his right arm at the shoulder joint.  He denies any numbness or tingling  In terms of review of systems he only reported joint issue with the right shoulder again denying pain  Medical  history of hypertension  Losartan 100mg , Prilosec, Glucophage/metformin: Just for weight loss  No smoking history currently former smoker in the past  Physical Exam Vitals and nursing note reviewed.  Constitutional:      Appearance: Normal appearance.  HENT:     Head: Normocephalic and atraumatic.  Eyes:     General: No scleral icterus.       Right eye: No discharge.        Left eye: No discharge.     Extraocular Movements: Extraocular movements intact.     Conjunctiva/sclera: Conjunctivae normal.     Pupils: Pupils are equal, round, and reactive to light.  Cardiovascular:     Rate and Rhythm: Normal rate.     Pulses: Normal pulses.  Skin:    General: Skin is warm and dry.     Capillary Refill: Capillary refill takes less than 2 seconds.  Neurological:     General: No focal deficit present.     Mental Status: He is alert and oriented to person, place, and time.  Psychiatric:        Mood and Affect: Mood normal.        Behavior: Behavior normal.        Thought Content: Thought content normal.        Judgment: Judgment normal.      Left shoulder upper extremity normal strength C5-T1  Right upper extremity there is weakness in C5, elbow flexion C6, wrist  extension C6  Grip strength normal finger abduction strength normal.  Abduction strength at the shoulder 3 out of 5, empty can 3 out of 5  No neck pain.  X-ray shoulder normal  X-ray cervical spine  There is a inferior osteophyte at C2 and C3 with normal disc space  At see 4 and 5 there is a large osteophyte from the inferior aspect of 4 in the superior aspect of 5 there is some abnormality of the vertebrae as well  And then at C5-6 there is another large osteophyte from the superior aspect of 6.  The cervical lordosis is abnormal  And then the uncovertebral joints and lateral masses have significant mid cervical arthritis with a large osteophyte seen on the [facility] view it is bridging the vertebrae of C4 and 5   Encounter Diagnoses  Name Primary?   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

## 2022-07-30 ENCOUNTER — Telehealth: Payer: Self-pay

## 2022-07-30 NOTE — Telephone Encounter (Signed)
Called patient to get 2024 insurance info. When I try to precert his imaging referral [employer] stated that his coverage terminated on 06/17/22. If he calls back we just need to update insurance info.

## 2022-08-02 ENCOUNTER — Inpatient Hospital Stay: Admission: RE | Admit: 2022-08-02 | Payer: 59 | Source: Ambulatory Visit

## 2022-08-02 ENCOUNTER — Telehealth: Payer: Self-pay | Admitting: Radiology

## 2022-08-02 DIAGNOSIS — R29898 Other symptoms and signs involving the musculoskeletal system: Secondary | ICD-10-CM

## 2022-08-02 DIAGNOSIS — M5020 Other cervical disc displacement, unspecified cervical region: Secondary | ICD-10-CM

## 2022-08-02 NOTE — Telephone Encounter (Signed)
Received denial of the MRI cervical spine he has to have 6 weeks conservative treatment first, will send for PT and call patient

## 2022-08-02 NOTE — Telephone Encounter (Signed)
I called him we discussed, he said therapy called him earlier he will call them back to schedule   To you FYI he needs conservative treatment before MRI

## 2022-08-02 NOTE — Telephone Encounter (Signed)
Patient returned your call, pt's # [phone]

## 2022-08-02 NOTE — Telephone Encounter (Signed)
Left message for him to call back so I can let him know about the MRI denial and the PT orders

## 2022-08-10 ENCOUNTER — Ambulatory Visit: Payer: 59 | Admitting: Orthopedic Surgery

## 2022-08-27 ENCOUNTER — Ambulatory Visit (HOSPITAL_COMMUNITY): Payer: 59 | Attending: Orthopedic Surgery | Admitting: Physical Therapy

## 2022-08-27 DIAGNOSIS — M542 Cervicalgia: Secondary | ICD-10-CM | POA: Diagnosis not present

## 2022-08-27 DIAGNOSIS — R29898 Other symptoms and signs involving the musculoskeletal system: Secondary | ICD-10-CM | POA: Insufficient documentation

## 2022-08-27 DIAGNOSIS — M5020 Other cervical disc displacement, unspecified cervical region: Secondary | ICD-10-CM | POA: Diagnosis not present

## 2022-08-27 DIAGNOSIS — M5412 Radiculopathy, cervical region: Secondary | ICD-10-CM | POA: Insufficient documentation
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
## 2022-08-29 ENCOUNTER — Ambulatory Visit (HOSPITAL_COMMUNITY): Payer: 59 | Admitting: Physical Therapy

## 2022-08-29 DIAGNOSIS — M5412 Radiculopathy, cervical region: Secondary | ICD-10-CM | POA: Diagnosis not present

## 2022-08-29 DIAGNOSIS — R29898 Other symptoms and signs involving the musculoskeletal system: Secondary | ICD-10-CM | POA: Diagnosis not present

## 2022-08-29 DIAGNOSIS — M5020 Other cervical disc displacement, unspecified cervical region: Secondary | ICD-10-CM | POA: Diagnosis not present

## 2022-08-29 DIAGNOSIS — M542 Cervicalgia: Secondary | ICD-10-CM
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
## 2022-09-05 ENCOUNTER — Ambulatory Visit (HOSPITAL_COMMUNITY): Payer: 59 | Admitting: Physical Therapy

## 2022-09-05 DIAGNOSIS — M542 Cervicalgia: Secondary | ICD-10-CM | POA: Diagnosis not present

## 2022-09-05 DIAGNOSIS — M5020 Other cervical disc displacement, unspecified cervical region: Secondary | ICD-10-CM | POA: Diagnosis not present

## 2022-09-05 DIAGNOSIS — M5412 Radiculopathy, cervical region: Secondary | ICD-10-CM

## 2022-09-05 DIAGNOSIS — R29898 Other symptoms and signs involving the musculoskeletal system: Secondary | ICD-10-CM | POA: Diagnosis not present

## 2022-09-05 NOTE — Therapy (Signed)
OUTPATIENT PHYSICAL THERAPY TREATMENT   Patient Name: Gregory Andersen MRN: [id number] DOB:July 10, 1966, 57 y.o., male Today's Date: 09/05/2022  END OF SESSION:  PT End of Session - 09/05/22 1559     Visit Number 3    Number of Visits 12    Date for PT Re-Evaluation 10/08/22    Authorization Type [employer] [facility]    Progress Note Due on Visit 10    PT Start Time 1602    PT Stop Time 1645    PT Time Calculation (min) 43 min    Activity Tolerance Patient tolerated treatment well    Behavior During Therapy WFL for tasks assessed/performed             Past Medical History:  Diagnosis Date   Hypertension    No past surgical history on file. There are no problems to display for this patient.   PCP: Allyn Kenner MD  REFERRING PROVIDER: Carole Civil, MD  REFERRING DIAG: [phone] (ICD-10-CM) - Right arm weakness M50.20 (ICD-10-CM) - Cervical disc herniation  THERAPY DIAG:  Cervicalgia  Radiculopathy, cervical region  Rationale for Evaluation and Treatment: Rehabilitation  ONSET DATE: 2 years   SUBJECTIVE:                                                                                                                                                                                                         SUBJECTIVE STATEMENT: Pt states his neck is hurting today 7/10 especially with extension. All other symptoms are the same as last visit.    Evaluation: Patient presents to therapy with complaint of neck pain and RT arm weakness. He says about 2 years ago he had an incident at work trying to push a tree limb into a dumptruck and he injured his shoulder. He did not follow up with that injury at that time. He has dealt with this issue since then. He has been having weakness in his RT arm and shoulder since then. He saw chiropractor but that was not very helpful. He has had recent imaging.   PERTINENT HISTORY:  NA  PAIN:  Are you having pain? No  PRECAUTIONS:  None  WEIGHT BEARING RESTRICTIONS: No  FALLS:  Has patient fallen in last 6 months? 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[employer]  PLOF: Independent  PATIENT GOALS: Get better  NEXT MD VISIT: none scheduled   OBJECTIVE:  DIAGNOSTIC FINDINGS:  Impression severe spondylosis C-spine  PATIENT SURVEYS:  FOTO 63% function   COGNITION: Overall cognitive status: Within functional limits for tasks assessed  SENSATION: Decreased sensation of RT hand  POSTURE: rounded shoulders  PALPATION: No noted TTP    CERVICAL ROM:   Active ROM A/PROM (deg) eval  Flexion 45  Extension 40  Right lateral flexion   Left lateral flexion   Right rotation 65  Left rotation 55   (Blank rows = not tested)  UPPER EXTREMITY ROM:  Active ROM Right eval Left eval  Shoulder flexion 90 165  Shoulder extension    Shoulder abduction 105 165  Shoulder adduction    Shoulder extension    Shoulder internal rotation    Shoulder external rotation    Elbow flexion    Elbow extension    Wrist flexion    Wrist extension    Wrist ulnar deviation    Wrist radial deviation    Wrist pronation    Wrist supination     (Blank rows = not tested)  UPPER EXTREMITY MMT:  MMT Right eval Left eval Right 08/29/22 Left 08/29/22  Shoulder flexion 3- 5    Shoulder extension      Shoulder abduction      Shoulder adduction      Shoulder extension      Shoulder internal rotation 5 5    Shoulder external rotation 4+ (extends elbow) 5    Middle trapezius      Lower trapezius      Elbow flexion 3+ 5    Elbow extension 4+ 5    Wrist flexion      Wrist extension      Wrist ulnar deviation      Wrist radial deviation      Wrist pronation      Wrist supination      Grip strength   110# 150#  Index thumb pinch strength   12# 22#  Middle  finger thumb pinch strength   10# 25#  25 (Blank rows = not tested)   TODAY'S TREATMENT:                                                                                                                              DATE:  09/05/22 UBE 4 minutes backward level 2 Standing: BTB rows 15X  BTB extension 15X Supine cervical traction max 22#, min 15# hold 1 minute relax 25" X 10 minutes total with MHP   08/29/22 Goal review HEP review Seated:  bicep curls attempted, unable  Hammer curls 1# weight 2X10  Press up Rt UE 10X  Scapular retraction 2X10 Standing: median nerve stretch 30" (no stretch obtained)  Wall push ups 10X Supine: shoulder flexion and abduction with cane, full ROM no difficulty  Manual soft tissue massage in seated to Rt Upper trap  Prone to Rt thoracic, middle trap and scapular mm   08/27/22 Eval   PATIENT  EDUCATION:  Education details: on Eval findings, POC and HEP  Person educated: Patient Education method: Explanation Education comprehension: verbalized understanding  HOME EXERCISE PROGRAM: Access Code: [email or web address] URL: [email or web address] Date: 08/27/2022 Prepared by: Josue Hector Exercises - Seated Scapular Retraction  - 3 x daily - 7 x weekly - 2 sets - 10 reps - 5 second hold - Seated Cervical Retraction  - 3 x daily - 7 x weekly - 2 sets - 10 reps - 5 second hold    ASSESSMENT:  CLINICAL IMPRESSION: Pt with increased pain today and in cervical region.  Began with UBE for mm warmup prior to shoulder/ postural strengthening using theraband.  Began traction treatment today in supine with moist heat applied to cervical region.  Some challenge with positioning due to large girth of neck, however able to position securely.  Very little pull obtained with 18# so increased to 22# with better stretch verbalized.  Pt reported slight improvement at end of session today.  Patient will continue to benefit from skilled physical therapy services to  address these deficits to reduce pain and improve level of function with ADLs   OBJECTIVE IMPAIRMENTS: decreased activity tolerance, decreased ROM, decreased strength, impaired perceived functional ability, impaired sensation, impaired UE functional use, improper body mechanics, postural dysfunction, and pain.   ACTIVITY LIMITATIONS: carrying, lifting, dressing, reach over head, hygiene/grooming, and caring for others  PARTICIPATION LIMITATIONS: meal prep, cleaning, laundry, driving, shopping, community activity, occupation, and yard work  PERSONAL FACTORS: Time since onset of injury/illness/exacerbation are also affecting patient's functional outcome.   REHAB POTENTIAL: Fair time since injury  CLINICAL DECISION MAKING: Stable/uncomplicated  EVALUATION COMPLEXITY: Low   GOALS: SHORT TERM GOALS: Target date: 09/17/2022  Patient will be independent with initial HEP and self-management strategies to improve functional outcomes Baseline:  Goal status: IN PROGRESS    LONG TERM GOALS: Target date: 10/08/2022  Patient will be independent with advanced HEP and self-management strategies to improve functional outcomes Baseline:  Goal status: IN PROGRESS  2.  Patient will improve FOTO score to predicted value to indicate improvement in functional outcomes Baseline: 63% Goal status: IN PROGRESS  3.  Patient will demo improved LT cervical rotation by 10 degrees in order to improve ability to scan environment for safety and while driving. Baseline: 55 deg Goal status: IN PROGRESS  4. Patient will have RT shoulder flexion AROM of at least 130 deg to improve ability to reach overhead/ perform overhead ADLS/ dressing/ grooming tasks. Baseline: 90 deg Goal status:  IN PROGRESS  PLAN:  PT FREQUENCY: 2x/week  PT DURATION: 6 weeks  PLANNED INTERVENTIONS: Therapeutic exercises, Therapeutic activity, Neuromuscular re-education, Balance training, Gait training, Patient/Family education, Joint  manipulation, Joint mobilization, Stair training, Aquatic Therapy, Dry Needling, Electrical stimulation, Spinal manipulation, Spinal mobilization, Cryotherapy, Moist heat, scar mobilization, Taping, Traction, Ultrasound, Biofeedback, Ionotophoresis 68m/ml Dexamethasone, and Manual therapy.   PLAN FOR NEXT SESSION: Progress scapular strengthening, RT arm ROM and RT UE strength as tolerated. Follow up if traction was beneficial.  4:55 PM, 09/05/22 ATeena Irani PTA/CLT CWest BishopPh: [phone]

## 2022-09-11 ENCOUNTER — Ambulatory Visit (HOSPITAL_COMMUNITY): Payer: 59 | Admitting: Physical Therapy

## 2022-09-11 DIAGNOSIS — M5412 Radiculopathy, cervical region: Secondary | ICD-10-CM | POA: Diagnosis not present

## 2022-09-11 DIAGNOSIS — M542 Cervicalgia: Secondary | ICD-10-CM

## 2022-09-11 DIAGNOSIS — M5020 Other cervical disc displacement, unspecified cervical region: Secondary | ICD-10-CM | POA: Diagnosis not present

## 2022-09-11 DIAGNOSIS — R29898 Other symptoms and signs involving the musculoskeletal system: Secondary | ICD-10-CM | POA: Diagnosis not present

## 2022-09-11 NOTE — Therapy (Signed)
OUTPATIENT PHYSICAL THERAPY TREATMENT   Patient Name: Gregory Andersen MRN: [id number] DOB:September 12, 1965, 57 y.o., male Today's Date: 09/11/2022  END OF SESSION:  PT End of Session - 09/11/22 1653     Visit Number 4    Number of Visits 12    Date for PT Re-Evaluation 10/08/22    Authorization Type [employer] [facility]    Progress Note Due on Visit 10    PT Start Time 1650    PT Stop Time 1728    PT Time Calculation (min) 38 min    Activity Tolerance Patient tolerated treatment well    Behavior During Therapy WFL for tasks assessed/performed             Past Medical History:  Diagnosis Date   Hypertension    No past surgical history on file. There are no problems to display for this patient.   PCP: Allyn Kenner MD  REFERRING PROVIDER: Carole Civil, MD  REFERRING DIAG: [phone] (ICD-10-CM) - Right arm weakness M50.20 (ICD-10-CM) - Cervical disc herniation  THERAPY DIAG:  Cervicalgia  Radiculopathy, cervical region  Rationale for Evaluation and Treatment: Rehabilitation  ONSET DATE: 2 years   SUBJECTIVE:                                                                                                                                                                                                         SUBJECTIVE STATEMENT: Patient states his sx are no different.   Evaluation: Patient presents to therapy with complaint of neck pain and RT arm weakness. He says about 2 years ago he had an incident at work trying to push a tree limb into a dumptruck and he injured his shoulder. He did not follow up with that injury at that time. He has dealt with this issue since then. He has been having weakness in his RT arm and shoulder since then. He saw chiropractor but that was not very helpful. He has had recent imaging.   PERTINENT HISTORY:  NA  PAIN:  Are you having pain? No  PRECAUTIONS: None  WEIGHT BEARING RESTRICTIONS: No  FALLS:  Has patient fallen in last 6 months?  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[employer]  PLOF: Independent  PATIENT GOALS: Get better  NEXT MD VISIT: none scheduled   OBJECTIVE:   DIAGNOSTIC FINDINGS:  Impression severe spondylosis C-spine  PATIENT SURVEYS:  FOTO 63% function  COGNITION: Overall cognitive status: Within functional limits for tasks assessed  SENSATION: Decreased sensation of RT hand  POSTURE: rounded shoulders  PALPATION: No noted TTP    CERVICAL ROM:   Active ROM A/PROM (deg) eval  Flexion 45  Extension 40  Right lateral flexion   Left lateral flexion   Right rotation 65  Left rotation 55   (Blank rows = not tested)  UPPER EXTREMITY ROM:  Active ROM Right eval Left eval  Shoulder flexion 90 165  Shoulder extension    Shoulder abduction 105 165  Shoulder adduction    Shoulder extension    Shoulder internal rotation    Shoulder external rotation    Elbow flexion    Elbow extension    Wrist flexion    Wrist extension    Wrist ulnar deviation    Wrist radial deviation    Wrist pronation    Wrist supination     (Blank rows = not tested)  UPPER EXTREMITY MMT:  MMT Right eval Left eval Right 08/29/22 Left 08/29/22  Shoulder flexion 3- 5    Shoulder extension      Shoulder abduction      Shoulder adduction      Shoulder extension      Shoulder internal rotation 5 5    Shoulder external rotation 4+ (extends elbow) 5    Middle trapezius      Lower trapezius      Elbow flexion 3+ 5    Elbow extension 4+ 5    Wrist flexion      Wrist extension      Wrist ulnar deviation      Wrist radial deviation      Wrist pronation      Wrist supination      Grip strength   110# 150#  Index thumb pinch strength   12# 22#  Middle finger thumb pinch strength   10# 25#  25 (Blank rows = not tested)   TODAY'S  TREATMENT:                                                                                                                              DATE:  09/11/22 UBE 2/2 minutes backward level 2  Standing:  BTB rows X20 BTB extension X20 5 plates lat pull downs 3 x 10  3lb eccentric curls RUE x10  Seated shoulder raises to 90 deg x 10 Seated wrist pronation/ supination with 3 lb x 20   Supine cervical traction min/ max 0-25lb, intermittent, 4 step, 1 min hold 10 min total time   09/05/22 UBE 4 minutes backward level 2 Standing: BTB rows 15X  BTB extension 15X Supine cervical traction max 22#, min 15# hold 1 minute relax 25" X 10 minutes total with MHP  08/29/22 Goal review HEP review Seated:  bicep curls attempted, unable  Hammer curls 1# weight 2X10  Press up Rt UE 10X  Scapular retraction 2X10 Standing:  median nerve stretch 30" (no stretch obtained)  Wall push ups 10X Supine: shoulder flexion and abduction with cane, full ROM no difficulty  Manual soft tissue massage in seated to Rt Upper trap  Prone to Rt thoracic, middle trap and scapular mm  PATIENT EDUCATION:  Education details: on Eval findings, POC and HEP  Person educated: Patient Education method: Explanation Education comprehension: verbalized understanding  HOME EXERCISE PROGRAM: Access Code: [email or web address] URL: [email or web address]  09/11/22 Access Code: [email or web address] URL: [email or web address] Date: 09/11/2022 Prepared by: Josue Hector  Exercises - Seated Scapular Retraction  - 2-3 x daily - 7 x weekly - 2 sets - 10 reps - 5 second hold - Seated Cervical Retraction  - 2-3 x daily - 7 x weekly - 2 sets - 10 reps - 5 second hold - Standing Shoulder Row with Anchored Resistance  - 2-3 x daily - 7 x weekly - 2 sets - 10 reps - Shoulder extension with resistance - Neutral  - 2-3 x daily - 7 x weekly - 2 sets - 10 reps - Standing Single Arm Eccentric Bicep Curl Pronated then Supinated  - 2-3 x  daily - 7 x weekly - 2 sets - 10 reps - 5 second hold - Forearm Pronation and Supination with Hammer  - 2-3 x daily - 7 x weekly - 2 sets - 10 reps - Sidelying Shoulder Flexion 15 Degrees  - 2-3 x daily - 7 x weekly - 2 sets - 10 reps - Sidelying Shoulder External Rotation  - 2-3 x daily - 7 x weekly - 2 sets - 10 reps - Sidelying Shoulder Abduction Palm Forward  - 2-3 x daily - 7 x weekly - 2 sets - 10 reps   Date: 08/27/2022 Prepared by: Josue Hector Exercises - Seated Scapular Retraction  - 3 x daily - 7 x weekly - 2 sets - 10 reps - 5 second hold - Seated Cervical Retraction  - 3 x daily - 7 x weekly - 2 sets - 10 reps - 5 second hold    ASSESSMENT:  CLINICAL IMPRESSION: Patient with ongoing weakness in RUE. Appears to be consistent with nerve injury. Drop arm test is negative and has no pain with AROM or PROM of RUE. RUE fatigues very quickly. Continued with mechanical cervical traction in attempt to decompress involved nerve roots. Discussed comprehensive HEP for improved shoulder and scapular function and issued updated handout. Patient will continue to benefit from skilled therapy services to reduce remaining deficits and improve functional ability.    OBJECTIVE IMPAIRMENTS: decreased activity tolerance, decreased ROM, decreased strength, impaired perceived functional ability, impaired sensation, impaired UE functional use, improper body mechanics, postural dysfunction, and pain.   ACTIVITY LIMITATIONS: carrying, lifting, dressing, reach over head, hygiene/grooming, and caring for others  PARTICIPATION LIMITATIONS: meal prep, cleaning, laundry, driving, shopping, community activity, occupation, and yard work  PERSONAL FACTORS: Time since onset of injury/illness/exacerbation are also affecting patient's functional outcome.   REHAB POTENTIAL: Fair time since injury  CLINICAL DECISION MAKING: Stable/uncomplicated  EVALUATION COMPLEXITY: Low   GOALS: SHORT TERM GOALS:  Target date: 09/17/2022  Patient will be independent with initial HEP and self-management strategies to improve functional outcomes Baseline:  Goal status: IN PROGRESS    LONG TERM GOALS: Target date: 10/08/2022  Patient will be independent with advanced HEP and self-management strategies to improve functional outcomes Baseline:  Goal status: IN PROGRESS  2.  Patient will improve FOTO score to predicted value to indicate improvement in functional outcomes Baseline:  63% Goal status: IN PROGRESS  3.  Patient will demo improved LT cervical rotation by 10 degrees in order to improve ability to scan environment for safety and while driving. Baseline: 55 deg Goal status: IN PROGRESS  4. Patient will have RT shoulder flexion AROM of at least 130 deg to improve ability to reach overhead/ perform overhead ADLS/ dressing/ grooming tasks. Baseline: 90 deg Goal status:  IN PROGRESS  PLAN:  PT FREQUENCY: 2x/week  PT DURATION: 6 weeks  PLANNED INTERVENTIONS: Therapeutic exercises, Therapeutic activity, Neuromuscular re-education, Balance training, Gait training, Patient/Family education, Joint manipulation, Joint mobilization, Stair training, Aquatic Therapy, Dry Needling, Electrical stimulation, Spinal manipulation, Spinal mobilization, Cryotherapy, Moist heat, scar mobilization, Taping, Traction, Ultrasound, Biofeedback, Ionotophoresis '4mg'$ /ml Dexamethasone, and Manual therapy.   PLAN FOR NEXT SESSION: Progress scapular strengthening, RT arm ROM and RT UE strength as tolerated. Follow up if traction was beneficial.  5:19 PM, 09/11/22 Josue Hector PT DPT  Physical Therapist with [facility]  [phone]

## 2022-09-17 ENCOUNTER — Encounter (HOSPITAL_COMMUNITY): Payer: Self-pay | Admitting: Physical Therapy

## 2022-09-17 ENCOUNTER — Encounter (HOSPITAL_COMMUNITY): Payer: 59 | Admitting: Physical Therapy

## 2022-09-17 NOTE — Therapy (Signed)
PHYSICAL THERAPY DISCHARGE SUMMARY  Visits from Start of Care: 4  Current functional level related to goals / functional outcomes: NA   Remaining deficits: Weakness, ROM deficits   Education / Equipment: Patient completed 4 sessions including stretching, strengthening, manual, cervical traction with no observed improvement. Issued comprehensive HEP. Patient requesting self DC due to lack of progress.    Patient agrees to discharge. Patient goals were not met. Patient is being discharged due to lack of progress.   4:43 PM, 09/17/22 Josue Hector PT DPT  Physical Therapist with [facility]  [phone]

## 2022-09-19 ENCOUNTER — Encounter (HOSPITAL_COMMUNITY): Payer: 59 | Admitting: Physical Therapy

## 2022-09-24 ENCOUNTER — Encounter (HOSPITAL_COMMUNITY): Payer: 59 | Admitting: Physical Therapy

## 2022-09-26 ENCOUNTER — Encounter (HOSPITAL_COMMUNITY): Payer: 59 | Admitting: Physical Therapy

## 2022-09-26 DIAGNOSIS — E785 Hyperlipidemia, unspecified: Secondary | ICD-10-CM | POA: Diagnosis not present

## 2022-09-26 DIAGNOSIS — E1169 Type 2 diabetes mellitus with other specified complication: Secondary | ICD-10-CM | POA: Diagnosis not present

## 2022-10-01 ENCOUNTER — Encounter (HOSPITAL_COMMUNITY): Payer: 59 | Admitting: Physical Therapy

## 2022-10-03 ENCOUNTER — Encounter (HOSPITAL_COMMUNITY): Payer: 59 | Admitting: Physical Therapy

## 2022-10-09 ENCOUNTER — Encounter (HOSPITAL_COMMUNITY): Payer: 59 | Admitting: Physical Therapy

## 2022-10-11 ENCOUNTER — Encounter (HOSPITAL_COMMUNITY): Payer: 59 | Admitting: Physical Therapy

## 2022-10-24 DIAGNOSIS — Z1211 Encounter for screening for malignant neoplasm of colon: Secondary | ICD-10-CM | POA: Diagnosis not present

## 2022-12-01 DIAGNOSIS — Z791 Long term (current) use of non-steroidal anti-inflammatories (NSAID): Secondary | ICD-10-CM | POA: Diagnosis not present

## 2022-12-01 DIAGNOSIS — N182 Chronic kidney disease, stage 2 (mild): Secondary | ICD-10-CM | POA: Diagnosis not present

## 2022-12-01 DIAGNOSIS — Z8249 Family history of ischemic heart disease and other diseases of the circulatory system: Secondary | ICD-10-CM | POA: Diagnosis not present

## 2022-12-01 DIAGNOSIS — E785 Hyperlipidemia, unspecified: Secondary | ICD-10-CM | POA: Diagnosis not present

## 2022-12-01 DIAGNOSIS — K219 Gastro-esophageal reflux disease without esophagitis: Secondary | ICD-10-CM | POA: Diagnosis not present

## 2022-12-01 DIAGNOSIS — Z825 Family history of asthma and other chronic lower respiratory diseases: Secondary | ICD-10-CM | POA: Diagnosis not present

## 2022-12-01 DIAGNOSIS — E1122 Type 2 diabetes mellitus with diabetic chronic kidney disease: Secondary | ICD-10-CM | POA: Diagnosis not present

## 2022-12-01 DIAGNOSIS — Z6839 Body mass index (BMI) 39.0-39.9, adult: Secondary | ICD-10-CM | POA: Diagnosis not present

## 2022-12-01 DIAGNOSIS — G8929 Other chronic pain: Secondary | ICD-10-CM | POA: Diagnosis not present

## 2022-12-01 DIAGNOSIS — Z87891 Personal history of nicotine dependence: Secondary | ICD-10-CM | POA: Diagnosis not present

## 2022-12-01 DIAGNOSIS — I129 Hypertensive chronic kidney disease with stage 1 through stage 4 chronic kidney disease, or unspecified chronic kidney disease: Secondary | ICD-10-CM | POA: Diagnosis not present

## 2022-12-17 ENCOUNTER — Telehealth: Payer: Self-pay | Admitting: Orthopedic Surgery

## 2022-12-17 NOTE — Telephone Encounter (Signed)
Dr. Mort Sawyers pt - pt lvm for Amy, stated he would like for Amy to call him regarding his MRI.  [phone]

## 2022-12-31 ENCOUNTER — Ambulatory Visit (INDEPENDENT_AMBULATORY_CARE_PROVIDER_SITE_OTHER): Payer: 59 | Admitting: Orthopedic Surgery

## 2022-12-31 ENCOUNTER — Encounter: Payer: Self-pay | Admitting: Orthopedic Surgery

## 2022-12-31 VITALS — BP 154/101 | HR 88 | Ht 74.0 in | Wt 293.0 lb

## 2022-12-31 DIAGNOSIS — G8929 Other chronic pain: Secondary | ICD-10-CM | POA: Diagnosis not present

## 2022-12-31 DIAGNOSIS — M25511 Pain in right shoulder: Secondary | ICD-10-CM | POA: Diagnosis not present

## 2022-12-31 NOTE — Patient Instructions (Addendum)
While we are working on your approval for MRI please go ahead and call to schedule your appointment with [facility] within at least one (1) week.  ° °[facility] [phone]  ° °

## 2022-12-31 NOTE — Progress Notes (Signed)
Chief Complaint  Patient presents with   Shoulder Pain    Right     68 old male with a week of right shoulder ordered an MRI and said he had to go to therapy  So he went to therapy his arm is still weak he still cannot lift it his cuff strength is 0 out of 5  Right Shoulder Exam   Tenderness  The patient is experiencing no tenderness.  Range of Motion  Active abduction:  abnormal  Passive abduction:  normal  Extension:  normal  External rotation:  normal  Forward flexion:  abnormal   Muscle Strength  Abduction: 0/5  Internal rotation: 5/5  External rotation: 5/5  Supraspinatus: 0/5  Subscapularis: 5/5  Biceps: 5/5   Tests  Drop arm: positive  Other  Erythema: absent Scars: absent Sensation: normal Pulse: present      MRI right shoulder  Rotator cuff tear  Call patient with results

## 2023-01-14 ENCOUNTER — Telehealth: Payer: Self-pay | Admitting: Orthopedic Surgery

## 2023-01-14 NOTE — Telephone Encounter (Signed)
Dr. Mort Sawyers pt - pt lvm this morning at 7:35am stating that he got his MRI scheduled for 7/06 and he stated that they told him it is only for his shoulder, he is wanting his shoulder and neck done at the same time.  [phone]

## 2023-01-15 NOTE — Telephone Encounter (Signed)
I sent message via email he is ordered for c spine and shoulder

## 2023-01-15 NOTE — Telephone Encounter (Signed)
Insurance denied the shoulder Approved the cspine he said he is not going for the MRI c spine He said cancel that one too I sent message to [facility]  To you FYI

## 2023-01-15 NOTE — Telephone Encounter (Signed)
[facility] Rinaldo Cloud states she meant to say c spine denied not shoulder, so shoulder auth'd and c spine denied. I did let the patient know it was other way around and he is still coming for the shoulder MRI.  Disregard previous message Patient is approved for the MRI shoulder and will have that one done.   The Cspine is denied.

## 2023-01-19 ENCOUNTER — Ambulatory Visit
Admission: RE | Admit: 2023-01-19 | Discharge: 2023-01-19 | Disposition: A | Discharge status: Home / Self Care | Payer: 59 | Source: Ambulatory Visit | Attending: Orthopedic Surgery | Admitting: Orthopedic Surgery

## 2023-01-19 DIAGNOSIS — M7581 Other shoulder lesions, right shoulder: Secondary | ICD-10-CM | POA: Diagnosis not present

## 2023-01-19 DIAGNOSIS — S46011A Strain of muscle(s) and tendon(s) of the rotator cuff of right shoulder, initial encounter: Secondary | ICD-10-CM | POA: Diagnosis not present

## 2023-01-19 DIAGNOSIS — M25511 Pain in right shoulder: Secondary | ICD-10-CM | POA: Diagnosis not present

## 2023-01-19 DIAGNOSIS — G8929 Other chronic pain: Secondary | ICD-10-CM

## 2023-02-25 ENCOUNTER — Ambulatory Visit: Payer: 59 | Admitting: Orthopedic Surgery

## 2023-03-13 ENCOUNTER — Telehealth: Payer: Self-pay | Admitting: Orthopedic Surgery

## 2023-03-13 NOTE — Telephone Encounter (Signed)
Dr. Mort Sawyers pt - pt lvm regarding his MRI results.  He had an appointment to review on 8/12, but that appt had to be cancelled because Dr. Romeo Apple was not able to be here.  The patient is very frustrated and would like to have someone call him with his results.  He state that he is a single parent and can not keep taking days off.  I have an opening tomorrow and he declined that opening.

## 2023-03-20 ENCOUNTER — Other Ambulatory Visit: Payer: Self-pay | Admitting: Orthopedic Surgery

## 2023-03-20 DIAGNOSIS — Z01818 Encounter for other preprocedural examination: Secondary | ICD-10-CM

## 2023-03-20 DIAGNOSIS — S46011D Strain of muscle(s) and tendon(s) of the rotator cuff of right shoulder, subsequent encounter: Secondary | ICD-10-CM

## 2023-03-20 DIAGNOSIS — S46011A Strain of muscle(s) and tendon(s) of the rotator cuff of right shoulder, initial encounter: Secondary | ICD-10-CM | POA: Insufficient documentation

## 2023-03-20 NOTE — Telephone Encounter (Signed)
Will call to schedule surgery on Nov 19th, rotator cuff repair anything else ? Any special equipment?

## 2023-04-09 DIAGNOSIS — E785 Hyperlipidemia, unspecified: Secondary | ICD-10-CM | POA: Diagnosis not present

## 2023-04-09 DIAGNOSIS — Z125 Encounter for screening for malignant neoplasm of prostate: Secondary | ICD-10-CM | POA: Diagnosis not present

## 2023-04-09 DIAGNOSIS — E1169 Type 2 diabetes mellitus with other specified complication: Secondary | ICD-10-CM | POA: Diagnosis not present

## 2023-04-30 ENCOUNTER — Telehealth: Payer: Self-pay | Admitting: Orthopedic Surgery

## 2023-04-30 NOTE — Telephone Encounter (Signed)
I spoke to him to see if we can RS to Nov 5th instead  He will check with his caretaker and let me know.

## 2023-04-30 NOTE — Telephone Encounter (Signed)
No, just found out that day was cancelled, Dr Rexene Edison not doing surgery he is on my list to call . I will call him.

## 2023-04-30 NOTE — Telephone Encounter (Signed)
I called patient about RS surgery he now states he has discussed with his employer and wants to file this surgery and previous visit as workers compensation  His employer is getting information turned into workers comp carrier and will notify him  For now surgery is cancelled and we will proceed accordingly when and if we get workers comp  approval  To you all FYI

## 2023-04-30 NOTE — Telephone Encounter (Signed)
Dr. Mort Sawyers pt - spoke w/the patient, he wants to know if his surgery is still on for 06/04/23.  He stated that he spoke with Dr. Romeo Apple on his personal phone and hasn't heard anything since.  He would like a call back [phone]

## 2023-05-01 ENCOUNTER — Telehealth: Payer: Self-pay | Admitting: Orthopedic Surgery

## 2023-05-01 NOTE — Telephone Encounter (Signed)
error 

## 2023-05-13 NOTE — Telephone Encounter (Signed)
I know, you see what we see, he never mentioned WC until I called him to RS surgery, all a bit odd, thanks.

## 2023-06-04 ENCOUNTER — Ambulatory Visit: Admit: 2023-06-04 | Payer: 59 | Admitting: Orthopedic Surgery

## 2023-06-04 SURGERY — REPAIR, ROTATOR CUFF, ARTHROSCOPIC
Anesthesia: Choice | Site: Shoulder | Laterality: Right

## 2023-07-16 ENCOUNTER — Other Ambulatory Visit: Payer: Self-pay | Admitting: Orthopedic Surgery

## 2023-07-16 DIAGNOSIS — R29898 Other symptoms and signs involving the musculoskeletal system: Secondary | ICD-10-CM

## 2023-07-16 DIAGNOSIS — G8929 Other chronic pain: Secondary | ICD-10-CM

## 2023-07-16 DIAGNOSIS — M5020 Other cervical disc displacement, unspecified cervical region: Secondary | ICD-10-CM

## 2023-08-16 DIAGNOSIS — R972 Elevated prostate specific antigen [PSA]: Secondary | ICD-10-CM | POA: Diagnosis not present

## 2023-08-16 DIAGNOSIS — E785 Hyperlipidemia, unspecified: Secondary | ICD-10-CM | POA: Diagnosis not present

## 2023-08-16 DIAGNOSIS — E1169 Type 2 diabetes mellitus with other specified complication: Secondary | ICD-10-CM | POA: Diagnosis not present

## 2024-01-15 DIAGNOSIS — E785 Hyperlipidemia, unspecified: Secondary | ICD-10-CM | POA: Diagnosis not present

## 2024-01-15 DIAGNOSIS — R972 Elevated prostate specific antigen [PSA]: Secondary | ICD-10-CM | POA: Diagnosis not present

## 2024-01-15 DIAGNOSIS — E1169 Type 2 diabetes mellitus with other specified complication: Secondary | ICD-10-CM | POA: Diagnosis not present
